# Patient Record
Sex: Male | Born: 1945 | Race: White | Hispanic: No | Marital: Married | Smoking: Former smoker
Health system: Southern US, Community
[De-identification: ages and names within clinical notes are randomized; demographics above are authoritative.]

## PROBLEM LIST (undated history)

## (undated) DIAGNOSIS — E669 Obesity, unspecified: Secondary | ICD-10-CM

## (undated) DIAGNOSIS — J45909 Unspecified asthma, uncomplicated: Secondary | ICD-10-CM

## (undated) DIAGNOSIS — B269 Mumps without complication: Secondary | ICD-10-CM

## (undated) DIAGNOSIS — E559 Vitamin D deficiency, unspecified: Secondary | ICD-10-CM

## (undated) DIAGNOSIS — E119 Type 2 diabetes mellitus without complications: Secondary | ICD-10-CM

## (undated) DIAGNOSIS — Z720 Tobacco use: Principal | ICD-10-CM

## (undated) DIAGNOSIS — E785 Hyperlipidemia, unspecified: Secondary | ICD-10-CM

## (undated) DIAGNOSIS — Z87891 Personal history of nicotine dependence: Secondary | ICD-10-CM

## (undated) DIAGNOSIS — I10 Essential (primary) hypertension: Secondary | ICD-10-CM

## (undated) DIAGNOSIS — M549 Dorsalgia, unspecified: Secondary | ICD-10-CM

## (undated) DIAGNOSIS — E538 Deficiency of other specified B group vitamins: Secondary | ICD-10-CM

## (undated) DIAGNOSIS — L989 Disorder of the skin and subcutaneous tissue, unspecified: Secondary | ICD-10-CM

## (undated) DIAGNOSIS — E1169 Type 2 diabetes mellitus with other specified complication: Secondary | ICD-10-CM

## (undated) DIAGNOSIS — B059 Measles without complication: Secondary | ICD-10-CM

## (undated) DIAGNOSIS — M25511 Pain in right shoulder: Secondary | ICD-10-CM

## (undated) DIAGNOSIS — J449 Chronic obstructive pulmonary disease, unspecified: Secondary | ICD-10-CM

## (undated) DIAGNOSIS — R0989 Other specified symptoms and signs involving the circulatory and respiratory systems: Secondary | ICD-10-CM

## (undated) DIAGNOSIS — F4310 Post-traumatic stress disorder, unspecified: Secondary | ICD-10-CM

## (undated) DIAGNOSIS — N400 Enlarged prostate without lower urinary tract symptoms: Secondary | ICD-10-CM

## (undated) DIAGNOSIS — B019 Varicella without complication: Secondary | ICD-10-CM

## (undated) HISTORY — PX: APPENDECTOMY: SHX54

## (undated) HISTORY — DX: Unspecified asthma, uncomplicated: J45.909

## (undated) HISTORY — DX: Hyperlipidemia, unspecified: E78.5

## (undated) HISTORY — DX: Essential (primary) hypertension: I10

## (undated) HISTORY — DX: Type 2 diabetes mellitus with other specified complication: E11.69

## (undated) HISTORY — DX: Varicella without complication: B01.9

## (undated) HISTORY — DX: Dorsalgia, unspecified: M54.9

## (undated) HISTORY — DX: Chronic obstructive pulmonary disease, unspecified: J44.9

## (undated) HISTORY — DX: Obesity, unspecified: E66.9

## (undated) HISTORY — DX: Pain in right shoulder: M25.511

## (undated) HISTORY — DX: Post-traumatic stress disorder, unspecified: F43.10

## (undated) HISTORY — DX: Type 2 diabetes mellitus without complications: E11.9

## (undated) HISTORY — DX: Personal history of nicotine dependence: Z87.891

## (undated) HISTORY — DX: Other specified symptoms and signs involving the circulatory and respiratory systems: R09.89

## (undated) HISTORY — PX: ROTATOR CUFF REPAIR: SHX139

## (undated) HISTORY — DX: Benign prostatic hyperplasia without lower urinary tract symptoms: N40.0

## (undated) HISTORY — PX: VASECTOMY: SHX75

## (undated) HISTORY — PX: FOOT SURGERY: SHX648

## (undated) HISTORY — DX: Vitamin D deficiency, unspecified: E55.9

## (undated) HISTORY — DX: Disorder of the skin and subcutaneous tissue, unspecified: L98.9

## (undated) HISTORY — DX: Tobacco use: Z72.0

## (undated) HISTORY — PX: NASAL SEPTUM SURGERY: SHX37

## (undated) HISTORY — DX: Mumps without complication: B26.9

## (undated) HISTORY — DX: Measles without complication: B05.9

## (undated) HISTORY — DX: Deficiency of other specified B group vitamins: E53.8

---

## 1999-05-26 ENCOUNTER — Encounter: Payer: Self-pay | Admitting: Gastroenterology

## 1999-05-26 ENCOUNTER — Encounter: Admission: RE | Admit: 1999-05-26 | Discharge: 1999-05-26 | Payer: Self-pay | Admitting: Gastroenterology

## 1999-07-27 ENCOUNTER — Ambulatory Visit: Admission: RE | Admit: 1999-07-27 | Discharge: 1999-07-27 | Payer: Self-pay | Admitting: Emergency Medicine

## 1999-08-10 ENCOUNTER — Ambulatory Visit (HOSPITAL_COMMUNITY): Admission: RE | Admit: 1999-08-10 | Discharge: 1999-08-10 | Payer: Self-pay | Admitting: Gastroenterology

## 2002-05-22 ENCOUNTER — Emergency Department (HOSPITAL_COMMUNITY): Admission: EM | Admit: 2002-05-22 | Discharge: 2002-05-22 | Payer: Self-pay | Admitting: Emergency Medicine

## 2002-05-22 ENCOUNTER — Encounter: Payer: Self-pay | Admitting: Emergency Medicine

## 2004-10-27 ENCOUNTER — Ambulatory Visit (HOSPITAL_COMMUNITY): Admission: RE | Admit: 2004-10-27 | Discharge: 2004-10-27 | Payer: Self-pay | Admitting: Gastroenterology

## 2012-09-14 LAB — HM COLONOSCOPY

## 2012-11-21 ENCOUNTER — Encounter: Payer: Self-pay | Admitting: Family Medicine

## 2012-11-21 ENCOUNTER — Ambulatory Visit (INDEPENDENT_AMBULATORY_CARE_PROVIDER_SITE_OTHER): Payer: 59 | Admitting: Family Medicine

## 2012-11-21 VITALS — BP 122/68 | HR 84 | Temp 98.8°F | Ht 68.0 in | Wt 208.1 lb

## 2012-11-21 DIAGNOSIS — E559 Vitamin D deficiency, unspecified: Secondary | ICD-10-CM

## 2012-11-21 DIAGNOSIS — I10 Essential (primary) hypertension: Secondary | ICD-10-CM

## 2012-11-21 DIAGNOSIS — E538 Deficiency of other specified B group vitamins: Secondary | ICD-10-CM

## 2012-11-21 DIAGNOSIS — Z87891 Personal history of nicotine dependence: Secondary | ICD-10-CM

## 2012-11-21 DIAGNOSIS — E119 Type 2 diabetes mellitus without complications: Secondary | ICD-10-CM

## 2012-11-21 DIAGNOSIS — E669 Obesity, unspecified: Secondary | ICD-10-CM | POA: Insufficient documentation

## 2012-11-21 DIAGNOSIS — J449 Chronic obstructive pulmonary disease, unspecified: Secondary | ICD-10-CM | POA: Insufficient documentation

## 2012-11-21 DIAGNOSIS — I999 Unspecified disorder of circulatory system: Secondary | ICD-10-CM

## 2012-11-21 DIAGNOSIS — E1169 Type 2 diabetes mellitus with other specified complication: Secondary | ICD-10-CM | POA: Insufficient documentation

## 2012-11-21 DIAGNOSIS — N400 Enlarged prostate without lower urinary tract symptoms: Secondary | ICD-10-CM

## 2012-11-21 DIAGNOSIS — E785 Hyperlipidemia, unspecified: Secondary | ICD-10-CM | POA: Insufficient documentation

## 2012-11-21 DIAGNOSIS — F17200 Nicotine dependence, unspecified, uncomplicated: Secondary | ICD-10-CM

## 2012-11-21 DIAGNOSIS — R0989 Other specified symptoms and signs involving the circulatory and respiratory systems: Secondary | ICD-10-CM

## 2012-11-21 DIAGNOSIS — Z720 Tobacco use: Secondary | ICD-10-CM

## 2012-11-21 DIAGNOSIS — J45909 Unspecified asthma, uncomplicated: Secondary | ICD-10-CM | POA: Insufficient documentation

## 2012-11-21 DIAGNOSIS — F4310 Post-traumatic stress disorder, unspecified: Secondary | ICD-10-CM | POA: Insufficient documentation

## 2012-11-21 HISTORY — DX: Tobacco use: Z72.0

## 2012-11-21 HISTORY — DX: Deficiency of other specified B group vitamins: E53.8

## 2012-11-21 HISTORY — DX: Personal history of nicotine dependence: Z87.891

## 2012-11-21 HISTORY — DX: Vitamin D deficiency, unspecified: E55.9

## 2012-11-21 HISTORY — DX: Benign prostatic hyperplasia without lower urinary tract symptoms: N40.0

## 2012-11-21 NOTE — Patient Instructions (Addendum)
Rel of rec immunization and last set of labsPreventive Care for Adults, Male A healthy lifestyle and preventive care can promote health and wellness. Preventive health guidelines for men include the following key practices:  A routine yearly physical is a good way to check with your caregiver about your health and preventative screening. It is a chance to share any concerns and updates on your health, and to receive a thorough exam.  Visit your dentist for a routine exam and preventative care every 6 months. Brush your teeth twice a day and floss once a day. Good oral hygiene prevents tooth decay and gum disease.  The frequency of eye exams is based on your age, health, family medical history, use of contact lenses, and other factors. Follow your caregiver's recommendations for frequency of eye exams.  Eat a healthy diet. Foods like vegetables, fruits, whole grains, low-fat dairy products, and lean protein foods contain the nutrients you need without too many calories. Decrease your intake of foods high in solid fats, added sugars, and salt. Eat the right amount of calories for you.Get information about a proper diet from your caregiver, if necessary.  Regular physical exercise is one of the most important things you can do for your health. Most adults should get at least 150 minutes of moderate-intensity exercise (any activity that increases your heart rate and causes you to sweat) each week. In addition, most adults need muscle-strengthening exercises on 2 or more days a week.  Maintain a healthy weight. The body mass index (BMI) is a screening tool to identify possible weight problems. It provides an estimate of body fat based on height and weight. Your caregiver can help determine your BMI, and can help you achieve or maintain a healthy weight.For adults 20 years and older:  A BMI below 18.5 is considered underweight.  A BMI of 18.5 to 24.9 is normal.  A BMI of 25 to 29.9 is considered  overweight.  A BMI of 30 and above is considered obese.  Maintain normal blood lipids and cholesterol levels by exercising and minimizing your intake of saturated fat. Eat a balanced diet with plenty of fruit and vegetables. Blood tests for lipids and cholesterol should begin at age 12 and be repeated every 5 years. If your lipid or cholesterol levels are high, you are over 50, or you are a high risk for heart disease, you may need your cholesterol levels checked more frequently.Ongoing high lipid and cholesterol levels should be treated with medicines if diet and exercise are not effective.  If you smoke, find out from your caregiver how to quit. If you do not use tobacco, do not start.  If you choose to drink alcohol, do not exceed 2 drinks per day. One drink is considered to be 12 ounces (355 mL) of beer, 5 ounces (148 mL) of wine, or 1.5 ounces (44 mL) of liquor.  Avoid use of street drugs. Do not share needles with anyone. Ask for help if you need support or instructions about stopping the use of drugs.  High blood pressure causes heart disease and increases the risk of stroke. Your blood pressure should be checked at least every 1 to 2 years. Ongoing high blood pressure should be treated with medicines, if weight loss and exercise are not effective.  If you are 69 to 67 years old, ask your caregiver if you should take aspirin to prevent heart disease.  Diabetes screening involves taking a blood sample to check your fasting blood sugar level.  This should be done once every 3 years, after age 51, if you are within normal weight and without risk factors for diabetes. Testing should be considered at a younger age or be carried out more frequently if you are overweight and have at least 1 risk factor for diabetes.  Colorectal cancer can be detected and often prevented. Most routine colorectal cancer screening begins at the age of 34 and continues through age 45. However, your caregiver may  recommend screening at an earlier age if you have risk factors for colon cancer. On a yearly basis, your caregiver may provide home test kits to check for hidden blood in the stool. Use of a small camera at the end of a tube, to directly examine the colon (sigmoidoscopy or colonoscopy), can detect the earliest forms of colorectal cancer. Talk to your caregiver about this at age 71, when routine screening begins. Direct examination of the colon should be repeated every 5 to 10 years through age 12, unless early forms of pre-cancerous polyps or small growths are found.  Hepatitis C blood testing is recommended for all people born from [birth year] through 1965 and any individual with known risks for hepatitis C.  Practice safe sex. Use condoms and avoid high-risk sexual practices to reduce the spread of sexually transmitted infections (STIs). STIs include gonorrhea, chlamydia, syphilis, trichomonas, herpes, HPV, and human immunodeficiency virus (HIV). Herpes, HIV, and HPV are viral illnesses that have no cure. They can result in disability, cancer, and death.  A one-time screening for abdominal aortic aneurysm (AAA) and surgical repair of large AAAs by sound wave imaging (ultrasonography) is recommended for ages 35 to 39 years who are current or former smokers.  Healthy men should no longer receive prostate-specific antigen (PSA) blood tests as part of routine cancer screening. Consult with your caregiver about prostate cancer screening.  Testicular cancer screening is not recommended for adult males who have no symptoms. Screening includes self-exam, caregiver exam, and other screening tests. Consult with your caregiver about any symptoms you have or any concerns you have about testicular cancer.  Use sunscreen with skin protection factor (SPF) of 30 or more. Apply sunscreen liberally and repeatedly throughout the day. You should seek shade when your shadow is shorter than you. Protect yourself by wearing long  sleeves, pants, a wide-brimmed hat, and sunglasses year round, whenever you are outdoors.  Once a month, do a whole body skin exam, using a mirror to look at the skin on your back. Notify your caregiver of new moles, moles that have irregular borders, moles that are larger than a pencil eraser, or moles that have changed in shape or color.  Stay current with required immunizations.  Influenza. You need a dose every fall (or winter). The composition of the flu vaccine changes each year, so being vaccinated once is not enough.  Pneumococcal polysaccharide. You need 1 to 2 doses if you smoke cigarettes or if you have certain chronic medical conditions. You need 1 dose at age 68 (or older) if you have never been vaccinated.  Tetanus, diphtheria, pertussis (Tdap, Td). Get 1 dose of Tdap vaccine if you are younger than age 57 years, are over 57 and have contact with an infant, are a Research scientist (physical sciences), or simply want to be protected from whooping cough. After that, you need a Td booster dose every 10 years. Consult your caregiver if you have not had at least 3 tetanus and diphtheria-containing shots sometime in your life or have a deep or  dirty wound.  HPV. This vaccine is recommended for males 13 through 67 years of age. This vaccine may be given to men 22 through 67 years of age who have not completed the 3 dose series. It is recommended for men through age 36 who have sex with men or whose immune system is weakened because of HIV infection, other illness, or medications. The vaccine is given in 3 doses over 6 months.  Measles, mumps, rubella (MMR). You need at least 1 dose of MMR if you were born in 1957 or later. You may also need a 2nd dose.  Meningococcal. If you are age 87 to 68 years and a Orthoptist living in a residence hall, or have one of several medical conditions, you need to get vaccinated against meningococcal disease. You may also need additional booster doses.  Zoster  (shingles). If you are age 35 years or older, you should get this vaccine.  Varicella (chickenpox). If you have never had chickenpox or you were vaccinated but received only 1 dose, talk to your caregiver to find out if you need this vaccine.  Hepatitis A. You need this vaccine if you have a specific risk factor for hepatitis A virus infection, or you simply wish to be protected from this disease. The vaccine is usually given as 2 doses, 6 to 18 months apart.  Hepatitis B. You need this vaccine if you have a specific risk factor for hepatitis B virus infection or you simply wish to be protected from this disease. The vaccine is given in 3 doses, usually over 6 months. Preventative Service / Frequency Ages 77 to 67  Blood pressure check.** / Every 1 to 2 years.  Lipid and cholesterol check.** / Every 5 years beginning at age 66.  Hepatitis C blood test.** / For any individual with known risks for hepatitis C.  Skin self-exam. / Monthly.  Influenza immunization.** / Every year.  Pneumococcal polysaccharide immunization.** / 1 to 2 doses if you smoke cigarettes or if you have certain chronic medical conditions.  Tetanus, diphtheria, pertussis (Tdap,Td) immunization. / A one-time dose of Tdap vaccine. After that, you need a Td booster dose every 10 years.  HPV immunization. / 3 doses over 6 months, if 26 and younger.  Measles, mumps, rubella (MMR) immunization. / You need at least 1 dose of MMR if you were born in 1957 or later. You may also need a 2nd dose.  Meningococcal immunization. / 1 dose if you are age 16 to 47 years and a Orthoptist living in a residence hall, or have one of several medical conditions, you need to get vaccinated against meningococcal disease. You may also need additional booster doses.  Varicella immunization.** / Consult your caregiver.  Hepatitis A immunization.** / Consult your caregiver. 2 doses, 6 to 18 months apart.  Hepatitis B  immunization.** / Consult your caregiver. 3 doses usually over 6 months. Ages 3 to 63  Blood pressure check.** / Every 1 to 2 years.  Lipid and cholesterol check.** / Every 5 years beginning at age 25.  Fecal occult blood test (FOBT) of stool. / Every year beginning at age 70 and continuing until age 1. You may not have to do this test if you get colonoscopy every 10 years.  Flexible sigmoidoscopy** or colonoscopy.** / Every 5 years for a flexible sigmoidoscopy or every 10 years for a colonoscopy beginning at age 29 and continuing until age 74.  Hepatitis C blood test.** / For all  people born from [birth year] through 1965 and any individual with known risks for hepatitis C.  Skin self-exam. / Monthly.  Influenza immunization.** / Every year.  Pneumococcal polysaccharide immunization.** / 1 to 2 doses if you smoke cigarettes or if you have certain chronic medical conditions.  Tetanus, diphtheria, pertussis (Tdap/Td) immunization.** / A one-time dose of Tdap vaccine. After that, you need a Td booster dose every 10 years.  Measles, mumps, rubella (MMR) immunization. / You need at least 1 dose of MMR if you were born in 1957 or later. You may also need a 2nd dose.  Varicella immunization.**/ Consult your caregiver.  Meningococcal immunization.** / Consult your caregiver.  Hepatitis A immunization.** / Consult your caregiver. 2 doses, 6 to 18 months apart.  Hepatitis B immunization.** / Consult your caregiver. 3 doses, usually over 6 months. Ages 69 and over  Blood pressure check.** / Every 1 to 2 years.  Lipid and cholesterol check.**/ Every 5 years beginning at age 94.  Fecal occult blood test (FOBT) of stool. / Every year beginning at age 43 and continuing until age 32. You may not have to do this test if you get colonoscopy every 10 years.  Flexible sigmoidoscopy** or colonoscopy.** / Every 5 years for a flexible sigmoidoscopy or every 10 years for a colonoscopy beginning at age 70  and continuing until age 64.  Hepatitis C blood test.** / For all people born from [birth year] through 1965 and any individual with known risks for hepatitis C.  Abdominal aortic aneurysm (AAA) screening.** / A one-time screening for ages 5 to 90 years who are current or former smokers.  Skin self-exam. / Monthly.  Influenza immunization.** / Every year.  Pneumococcal polysaccharide immunization.** / 1 dose at age 98 (or older) if you have never been vaccinated.  Tetanus, diphtheria, pertussis (Tdap, Td) immunization. / A one-time dose of Tdap vaccine if you are over 65 and have contact with an infant, are a Research scientist (physical sciences), or simply want to be protected from whooping cough. After that, you need a Td booster dose every 10 years.  Varicella immunization. ** / Consult your caregiver.  Meningococcal immunization.** / Consult your caregiver.  Hepatitis A immunization. ** / Consult your caregiver. 2 doses, 6 to 18 months apart.  Hepatitis B immunization.** / Check with your caregiver. 3 doses, usually over 6 months. **Family history and personal history of risk and conditions may change your caregiver's recommendations. Document Released: 08/29/2001 Document Revised: 09/25/2011 Document Reviewed: 11/28/2010 [facility] Patient Information 2013 [place], [place].  at the [facility]

## 2012-11-24 NOTE — Assessment & Plan Note (Signed)
Avoid trans fats, consider krill oil supplement. Continue Gemfibrozil

## 2012-11-24 NOTE — Assessment & Plan Note (Signed)
Patient takes a supplement

## 2012-11-24 NOTE — Assessment & Plan Note (Signed)
Follows at the [place]

## 2012-11-24 NOTE — Assessment & Plan Note (Signed)
Encouraged aspirin, quit smoking, krill oil ongoing exercise

## 2012-11-24 NOTE — Assessment & Plan Note (Signed)
Well controlled,  No control

## 2012-11-24 NOTE — Assessment & Plan Note (Signed)
Discussed various strategies for cessation will proceed without medications initially and will let us know if he is not succeeding

## 2012-11-24 NOTE — Assessment & Plan Note (Addendum)
Has his labs followed at [facility] agrees to try and supply [place] with a copy of his most recent labs just for a baseline. Reports good control, believes his last hgba1c was 7.4

## 2012-11-24 NOTE — Progress Notes (Signed)
Patient ID: [facility], male   DOB: 22-Feb-1946, 67 y.o.   MRN: [id number] [facility] [id number] 04-Dec-1945 11/24/2012      Progress Note New Patient  Subjective  Chief Complaint  Chief Complaint  Patient presents with  . Establish Care    new patient    HPI  Patient is a 67 year old Caucasian male who is in today to establish care. He receives the vast majority of his care at the [facility]. He has a 70% service-connected disability related to his PTSD from his service in [facility] to receive care there on a regular basis. Needs a local physician for acute issues. No recent illness. No fevers or chills. Reports his last hemoglobin A1c was 7.4. Has a long-standing history of intermittent sensation of lightheaded versus dizzy feelings not worsening. Notes occasional nausea and varicose veins in his right leg which are largely not symptomatic. Denies chest pain or palpitations. No shortness of breath GI or GU concerns otherwise noted today.  Past Medical History  Diagnosis Date  . Measles as a child  . Mumps as a child  . Chicken pox as a child  . 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 Hyperlipidemia   . Poor circulation     in legs  . Hypertension   . Asthma   . COPD (chronic obstructive pulmonary disease) 67 yrs old  . Tobacco use 11/21/2012  . PTSD (post-traumatic stress disorder)   . BPH (benign prostatic hyperplasia) 11/21/2012  . Unspecified vitamin D deficiency 11/21/2012  . Vitamin B12 deficiency 11/21/2012    Past Surgical History  Procedure Laterality Date  . Appendectomy    . Foot surgery  40 yrs ago    left foot  . Rotator cuff repair  67 yrs old    left shoulder  . Nasal septum surgery  67 yrs old  . Vasectomy  43    Family History  Problem Relation Age of Onset  . Diabetes Mother     type 2  . Hypertension Mother   . Hyperlipidemia Mother   . Stroke Mother   . Diabetes Father     type 2  . Ulcers Father   .  Hypertension Father   . Hyperlipidemia Father   . Heart attack Father     X 5  . Cancer Father     around aorta  . Heart disease Sister   . Heart disease Brother   . Diabetes Brother     type 2  . Diabetes Son     type 2  . Diabetes Sister     type 2- diabetic coma  . Diabetes Sister     ?  Marland Kitchen Dementia Sister   . Cancer Brother 50    colon  . Diabetes Brother     type 2  . Leukemia Brother   . Dementia Brother     History   Social History  . Marital Status: Married    Spouse Name: N/A    Number of Children: N/A  . Years of Education: N/A   Occupational History  . Not on file.   Social History Main Topics  . Smoking status: Current Every Day Smoker -- 1.00 packs/day for 55 years    Types: Cigarettes  . Smokeless tobacco: Never Used  . Alcohol Use: Yes     Comment: socially- hardly ever  . Drug Use: No  . Sexually Active: Not on file  Other Topics Concern  . Not on file   Social History Narrative  . No narrative on file    No current outpatient prescriptions on file prior to visit.   No current facility-administered medications on file prior to visit.    No Known Allergies  Review of Systems  Review of Systems  Constitutional: Negative for fever, chills and malaise/fatigue.  HENT: Negative for hearing loss, nosebleeds and congestion.   Eyes: Negative for discharge.  Respiratory: Positive for shortness of breath. Negative for cough, sputum production and wheezing.   Cardiovascular: Negative for chest pain, palpitations and leg swelling.  Gastrointestinal: Negative for heartburn, nausea, vomiting, abdominal pain, diarrhea, constipation and blood in stool.  Genitourinary: Negative for dysuria, urgency, frequency and hematuria.  Musculoskeletal: Negative for myalgias, back pain and falls.  Skin: Negative for rash.  Neurological: Positive for dizziness. Negative for tremors, sensory change, focal weakness, loss of consciousness, weakness and headaches.   Endo/Heme/Allergies: Negative for polydipsia. Does not bruise/bleed easily.  Psychiatric/Behavioral: Negative for depression and suicidal ideas. The patient is nervous/anxious. The patient does not have insomnia.     Objective  BP 122/68  Pulse 84  Temp(Src) 98.8 F (37.1 C) (Oral)  Ht 5\' 8"  (1.727 m)  Wt 208 lb 1.9 oz (94.403 kg)  BMI 31.65 kg/m2  SpO2 97%  Physical Exam  Physical Exam  Constitutional: He is oriented to person, place, and time and well-developed, well-nourished, and in no distress. No distress.  HENT:  Head: Normocephalic and atraumatic.  Right Ear: External ear normal.  Left Ear: External ear normal.  Nose: Nose normal.  Mouth/Throat: Oropharynx is clear and moist. No oropharyngeal exudate.  Eyes: Conjunctivae and EOM are normal. Pupils are equal, round, and reactive to light. Right eye exhibits no discharge. Left eye exhibits no discharge. No scleral icterus.  Neck: Neck supple. No thyromegaly present.  Cardiovascular: Normal rate, regular rhythm and normal heart sounds.   Pulmonary/Chest: Effort normal and breath sounds normal. No respiratory distress.  Abdominal: He exhibits no distension and no mass. There is no tenderness.  Musculoskeletal: Normal range of motion. He exhibits no edema and no tenderness.  Lymphadenopathy:    He has no cervical adenopathy.  Neurological: He is alert and oriented to person, place, and time.  Skin: Skin is warm and dry. No erythema.  Psychiatric: Memory, affect and judgment normal.       Assessment & Plan  Tobacco use Discussed various strategies for cessation will proceed without medications initially and will let us know if he is not succeeding  Hyperlipidemia Avoid trans fats, consider krill oil supplement. Continue Gemfibrozil  Diabetes mellitus without complication Has his labs followed at [facility] agrees to try and supply [place] with a copy of his most recent labs just for a baseline. Reports good control, believes  his last hgba1c was 7.4  Poor circulation Encouraged aspirin, quit smoking, krill oil ongoing exercise   Hypertension Well controlled,  No control  PTSD (post-traumatic stress disorder) Follows at the [facility]  Unspecified vitamin D deficiency Patient takes a supplement

## 2012-11-29 ENCOUNTER — Telehealth: Payer: Self-pay | Admitting: Family Medicine

## 2012-11-29 NOTE — Telephone Encounter (Signed)
Received medical records from the Department of [facility].

## 2012-12-10 ENCOUNTER — Encounter: Payer: Self-pay | Admitting: Family Medicine

## 2013-04-24 ENCOUNTER — Encounter: Payer: Self-pay | Admitting: Family Medicine

## 2013-04-24 ENCOUNTER — Telehealth (INDEPENDENT_AMBULATORY_CARE_PROVIDER_SITE_OTHER): Payer: 59 | Admitting: Family Medicine

## 2013-04-24 VITALS — BP 122/62 | HR 77 | Temp 98.3°F | Ht 68.0 in | Wt 208.0 lb

## 2013-04-24 DIAGNOSIS — L989 Disorder of the skin and subcutaneous tissue, unspecified: Secondary | ICD-10-CM

## 2013-04-24 DIAGNOSIS — E119 Type 2 diabetes mellitus without complications: Secondary | ICD-10-CM

## 2013-04-24 DIAGNOSIS — E785 Hyperlipidemia, unspecified: Secondary | ICD-10-CM

## 2013-04-24 DIAGNOSIS — Z23 Encounter for immunization: Secondary | ICD-10-CM

## 2013-04-24 DIAGNOSIS — M549 Dorsalgia, unspecified: Secondary | ICD-10-CM

## 2013-04-24 DIAGNOSIS — I10 Essential (primary) hypertension: Secondary | ICD-10-CM

## 2013-04-24 LAB — CBC
HCT: 38.1 % — ABNORMAL LOW (ref 39.0–52.0)
MCV: 93.6 fL (ref 78.0–100.0)
Platelets: 196 10*3/uL (ref 150–400)
RBC: 4.07 MIL/uL — ABNORMAL LOW (ref 4.22–5.81)
WBC: 5.7 10*3/uL (ref 4.0–10.5)

## 2013-04-24 LAB — LIPID PANEL
Cholesterol: 138 mg/dL (ref 0–200)
HDL: 34 mg/dL — ABNORMAL LOW (ref >39)
Total CHOL/HDL Ratio: 4.1 Ratio
Triglycerides: 238 mg/dL — ABNORMAL HIGH (ref <150)
VLDL: 48 mg/dL — ABNORMAL HIGH (ref 0–40)

## 2013-04-24 LAB — HEPATIC FUNCTION PANEL
Albumin: 4.4 g/dL (ref 3.5–5.2)
Total Bilirubin: 0.4 mg/dL (ref 0.3–1.2)
Total Protein: 6.6 g/dL (ref 6.0–8.3)

## 2013-04-24 LAB — RENAL FUNCTION PANEL
CO2: 25 mEq/L (ref 19–32)
Chloride: 107 mEq/L (ref 96–112)
Phosphorus: 2.3 mg/dL (ref 2.3–4.6)
Sodium: 139 mEq/L (ref 135–145)

## 2013-04-24 LAB — HEMOGLOBIN A1C
Hgb A1c MFr Bld: 6.3 % — ABNORMAL HIGH (ref <5.7)
Mean Plasma Glucose: 134 mg/dL — ABNORMAL HIGH (ref <117)

## 2013-04-24 NOTE — Patient Instructions (Signed)
Actinic Keratosis  Actinic keratosis is a precancerous growth on the skin. This means it could develop into skin cancer if it is not treated. About 1% of actinic keratoses turn into skin cancer within a year. It is important to have all such growths removed to prevent them from developing into skin cancer.  CAUSES   Actinic keratosis is caused by getting too much ultraviolet (UV) radiation from the sun or other UV light sources.  RISK FACTORS  Factors that increase your chances of getting actinic keratosis include:   Having light-colored skin and blue eyes.   Having blonde or red hair.   Spending a lot of time in the sun.   Age. The risk of actinic keratosis increases with age.  SYMPTOMS   Actinic keratosis growths look like scaly, rough spots of skin. They can be as small as a pinhead or as big as a quarter. They may itch, hurt, or feel sensitive. Sometimes there is a little tag of pink or gray skin growing off them. In some cases, actinic keratoses are easier felt than seen. They do not go away with the use of moisturizing lotions or creams. Actinic keratoses appear most often on areas of skin that get a lot of sun exposure. These areas include the:   Scalp.   Face.   Ears.   Lips.   Upper back.   Backs of the hands.   Forearms.  DIAGNOSIS   Your caregiver can usually tell what is wrong by performing a physical exam. A tissue sample (biopsy) may also be taken and examined under a microscope.  TREATMENT   Actinic keratosis can be treated several ways. Most treatments can be done in your caregiver's office. Treatment options may include:   Curettage. A tool is used to gently scrape off the growth.   Cryosurgery. Liquid nitrogen is applied to the growth to freeze it. The growth eventually falls off the skin.   Medicated creams, such as 5-fluorouracil or imiquimod. The medicine destroys the cells in the growth.    Chemical peels. Chemicals are applied to the growth and the outer layers of skin are peeled off.   Photodynamic therapy. A drug that makes your skin more sensitive to light is applied to the skin. A strong, blue light is aimed at the skin and destroys the growth.  PREVENTION   To prevent future sun damage:   Try to avoid the sun between 10:00 a.m. and 4:00 p.m. when it is the strongest.   Use a sunscreen or sunblock with SPF 30 or greater.   Apply sunscreen at least 30 minutes before exposure to the sun.   Always wear protective hats, clothing, and sunglasses with UV protection.   Avoid medicines, herbs, and foods that increase your sensitivity to sunlight.   Avoid tanning beds.  HOME CARE INSTRUCTIONS    If your skin was covered with a bandage, change and remove the bandage as directed by your caregiver.   Keep the treated area dry as directed by your caregiver.   Apply any creams as prescribed by your caregiver. Follow the directions carefully.   Check your skin regularly for any changes.   Visit a skin doctor (dermatologist) every year for a skin exam.  SEEK MEDICAL CARE IF:    Your skin does not heal and becomes irritated, red, or bleeds.   You notice any changes or new growths on your skin.  Document Released: 09/29/2008 Document Revised: 09/25/2011 Document Reviewed: 08/14/2011  [employer] Patient Information   2014 [employer]

## 2013-04-24 NOTE — Progress Notes (Signed)
Patient ID: ARAV BANNISTER, male   DOB: [birth year], 67 y.o.   MRN: [id number] HARLON KUTNER [id number] 1946/04/15 04/24/2013      Progress Note-Follow Up  Subjective  Chief Complaint  Chief Complaint  Patient presents with  . sore on arm    not healing- left arm  . Injections    flu- high dose    HPI  This is a 67 Caucasian male who is in today to discuss a nonhealing lesion on his left arm. It's been present for some time roughly one to 2 months and will not resolve. He denies any trauma. He denies any infection or pain. Has been mildly scaly but not itchy. Otherwise feels well. No recent illness fevers chills. No polyuria or polydipsia. No chest pain, palpitations, shortness of breath, GI or GU complaints.  Past Medical History  Diagnosis Date  . Measles as a child  . Mumps as a child  . Chicken pox as a child  . 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 Hyperlipidemia   . Poor circulation     in legs  . Hypertension   . Asthma   . COPD (chronic obstructive pulmonary disease) 67 yrs old  . Tobacco use 11/21/2012  . PTSD (post-traumatic stress disorder)   . BPH (benign prostatic hyperplasia) 11/21/2012  . Unspecified vitamin D deficiency 11/21/2012  . Vitamin B12 deficiency 11/21/2012    Past Surgical History  Procedure Laterality Date  . Appendectomy    . Foot surgery  40 yrs ago    left foot  . Rotator cuff repair  67 yrs old    left shoulder  . Nasal septum surgery  67 yrs old  . Vasectomy  19    Family History  Problem Relation Age of Onset  . Diabetes Mother     type 2  . Hypertension Mother   . Hyperlipidemia Mother   . Stroke Mother   . Diabetes Father     type 2  . Ulcers Father   . Hypertension Father   . Hyperlipidemia Father   . Heart attack Father     X 5  . Cancer Father     around aorta  . Heart disease Sister   . Heart disease Brother   . Diabetes Brother     type 2  . Diabetes Son     type 2  . Diabetes Sister      type 2- diabetic coma  . Diabetes Sister     ?  Marland Kitchen Dementia Sister   . Cancer Brother 50    colon  . Diabetes Brother     type 2  . Leukemia Brother   . Dementia Brother     History   Social History  . Marital Status: Married    Spouse Name: N/A    Number of Children: N/A  . Years of Education: N/A   Occupational History  . Not on file.   Social History Main Topics  . Smoking status: Current Every Day Smoker -- 1.00 packs/day for 55 years    Types: Cigarettes  . Smokeless tobacco: Never Used  . Alcohol Use: Yes     Comment: socially- hardly ever  . Drug Use: No  . Sexual Activity: Not on file   Other Topics Concern  . Not on file   Social History Narrative  . No narrative on file    Current Outpatient Prescriptions on File Prior to  Visit  Medication Sig Dispense Refill  . aspirin 81 MG tablet Take 81 mg by mouth daily.      . Cholecalciferol (VITAMIN D-3) 1000 UNITS CAPS Take 1 capsule by mouth daily.      . cyanocobalamin 500 MCG tablet Take 500 mcg by mouth daily.      Marland Kitchen gemfibrozil (LOPID) 600 MG tablet Take 600 mg by mouth 2 (two) times daily before a meal.      . glipiZIDE (GLUCOTROL) 10 MG tablet Take 10 mg by mouth 2 (two) times daily before a meal.      . lisinopril (PRINIVIL,ZESTRIL) 20 MG tablet Take 20 mg by mouth daily.      . metFORMIN (GLUCOPHAGE) 1000 MG tablet Take 1,000 mg by mouth 2 (two) times daily with a meal.      . Multiple Vitamin (MULTIVITAMIN) tablet Take 1 tablet by mouth daily.      . Omega-3 Fatty Acids (FISH OIL) 1000 MG CAPS Take 1 capsule by mouth 2 (two) times daily.      . sertraline (ZOLOFT) 100 MG tablet Take 100 mg by mouth daily.      . tamsulosin (FLOMAX) 0.4 MG CAPS Take 0.4 mg by mouth daily.      . traZODone (DESYREL) 100 MG tablet Take 100 mg by mouth at bedtime.       No current facility-administered medications on file prior to visit.    No Known Allergies  Review of Systems  Review of Systems  Constitutional:  Negative for fever and malaise/fatigue.  HENT: Negative for congestion.   Eyes: Negative for discharge.  Respiratory: Negative for shortness of breath.   Cardiovascular: Negative for chest pain, palpitations and leg swelling.  Gastrointestinal: Negative for nausea, abdominal pain and diarrhea.  Genitourinary: Negative for dysuria.  Musculoskeletal: Negative for falls.  Skin: Negative for rash.       Lesion right arm raised, mildly tender, not warm or hot no drainage or scabbing  Neurological: Negative for loss of consciousness and headaches.  Endo/Heme/Allergies: Negative for polydipsia.  Psychiatric/Behavioral: Negative for depression and suicidal ideas. The patient is not nervous/anxious and does not have insomnia.     Objective  BP 122/62  Pulse 77  Temp(Src) 98.3 F (36.8 C) (Oral)  Ht 5\' 8"  (1.727 m)  Wt 208 lb (94.348 kg)  BMI 31.63 kg/m2  SpO2 97%  Physical Exam  Physical Exam  Constitutional: He is oriented to person, place, and time and well-developed, well-nourished, and in no distress. No distress.  HENT:  Head: Normocephalic and atraumatic.  Eyes: Conjunctivae are normal.  Neck: Neck supple. No thyromegaly present.  Cardiovascular: Normal rate, regular rhythm and normal heart sounds.   No murmur heard. Pulmonary/Chest: Effort normal and breath sounds normal. No respiratory distress.  Abdominal: He exhibits no distension and no mass. There is no tenderness.  Musculoskeletal: He exhibits no edema.  Neurological: He is alert and oriented to person, place, and time.  Skin: Skin is warm.  1 cm raised firm mildly erythematous lesion right forearm no fluctuance or warmth  Psychiatric: Memory, affect and judgment normal.      Assessment & Plan  Hypertension Well controlled at this time no changes  Skin lesion of left arm Lesion has been persistent for a couple months on left arm and is not resolving will refer to dermatology for further consideration.    Diabetes mellitus without complication Given flu shot today

## 2013-04-25 LAB — URINALYSIS
Leukocytes, UA: NEGATIVE
Nitrite: NEGATIVE
Specific Gravity, Urine: 1.012 (ref 1.005–1.030)
pH: 7.5 (ref 5.0–8.0)

## 2013-04-25 LAB — TSH: TSH: 1.417 u[IU]/mL (ref 0.350–4.500)

## 2013-04-27 ENCOUNTER — Encounter: Payer: Self-pay | Admitting: Family Medicine

## 2013-04-27 DIAGNOSIS — L989 Disorder of the skin and subcutaneous tissue, unspecified: Secondary | ICD-10-CM

## 2013-04-27 HISTORY — DX: Disorder of the skin and subcutaneous tissue, unspecified: L98.9

## 2013-04-27 NOTE — Assessment & Plan Note (Signed)
Well controlled at this time no changes

## 2013-04-27 NOTE — Assessment & Plan Note (Signed)
Given flu shot  today 

## 2013-04-27 NOTE — Assessment & Plan Note (Signed)
Lesion has been persistent for a couple months on left arm and is not resolving will refer to dermatology for further consideration.

## 2013-05-19 ENCOUNTER — Ambulatory Visit (INDEPENDENT_AMBULATORY_CARE_PROVIDER_SITE_OTHER): Payer: 59 | Admitting: Family Medicine

## 2013-05-19 ENCOUNTER — Encounter: Payer: Self-pay | Admitting: Family Medicine

## 2013-05-19 ENCOUNTER — Ambulatory Visit (HOSPITAL_BASED_OUTPATIENT_CLINIC_OR_DEPARTMENT_OTHER)
Admission: RE | Admit: 2013-05-19 | Discharge: 2013-05-19 | Disposition: A | Discharge status: Home / Self Care | Payer: 59 | Source: Ambulatory Visit | Attending: Family Medicine | Admitting: Family Medicine

## 2013-05-19 VITALS — BP 118/72 | HR 80 | Temp 97.8°F | Ht 68.0 in | Wt 201.0 lb

## 2013-05-19 DIAGNOSIS — M549 Dorsalgia, unspecified: Secondary | ICD-10-CM | POA: Insufficient documentation

## 2013-05-19 DIAGNOSIS — I10 Essential (primary) hypertension: Secondary | ICD-10-CM

## 2013-05-19 DIAGNOSIS — M47817 Spondylosis without myelopathy or radiculopathy, lumbosacral region: Secondary | ICD-10-CM | POA: Insufficient documentation

## 2013-05-19 DIAGNOSIS — E119 Type 2 diabetes mellitus without complications: Secondary | ICD-10-CM

## 2013-05-19 DIAGNOSIS — Z720 Tobacco use: Secondary | ICD-10-CM

## 2013-05-19 DIAGNOSIS — F17200 Nicotine dependence, unspecified, uncomplicated: Secondary | ICD-10-CM

## 2013-05-19 MED ORDER — METHYLPREDNISOLONE (PAK) 4 MG PO TABS: ORAL_TABLET | ORAL | Status: DC

## 2013-05-19 MED ORDER — METHOCARBAMOL 500 MG PO TABS: 500.0000 mg | ORAL_TABLET | Freq: Every evening | ORAL | Status: DC | PRN

## 2013-05-19 NOTE — Patient Instructions (Signed)
Try moist heat to low back and salon pas patches or cream Hold Ibuprofen while taking steroids (Medrol dosepak) minimimize  Back Pain, Adult Low back pain is very common. About 1 in 5 people have back pain.The cause of low back pain is rarely dangerous. The pain often gets better over time.About half of people with a sudden onset of back pain feel better in just 2 weeks. About 8 in 10 people feel better by 6 weeks.  CAUSES Some common causes of back pain include:  Strain of the muscles or ligaments supporting the spine.  Wear and tear (degeneration) of the spinal discs.  Arthritis.  Direct injury to the back. DIAGNOSIS Most of the time, the direct cause of low back pain is not known.However, back pain can be treated effectively even when the exact cause of the pain is unknown.Answering your caregiver's questions about your overall health and symptoms is one of the most accurate ways to make sure the cause of your pain is not dangerous. If your caregiver needs more information, he or she may order lab work or imaging tests (X-rays or MRIs).However, even if imaging tests show changes in your back, this usually does not require surgery. HOME CARE INSTRUCTIONS For many people, back pain returns.Since low back pain is rarely dangerous, it is often a condition that people can learn to [facility] their own.   Remain active. It is stressful on the back to sit or stand in one place. Do not sit, drive, or stand in one place for more than 30 minutes at a time. Take short walks on level surfaces as soon as pain allows.Try to increase the length of time you walk each day.  Do not stay in bed.Resting more than 1 or 2 days can delay your recovery.  Do not avoid exercise or work.Your body is made to move.It is not dangerous to be active, even though your back may hurt.Your back will likely heal faster if you return to being active before your pain is gone.  Pay attention to your body when you  bend and lift. Many people have less discomfortwhen lifting if they bend their knees, keep the load close to their bodies,and avoid twisting. Often, the most comfortable positions are those that put less stress on your recovering back.  Find a comfortable position to sleep. Use a firm mattress and lie on your side with your knees slightly bent. If you lie on your back, put a pillow under your knees.  Only take over-the-counter or prescription medicines as directed by your caregiver. Over-the-counter medicines to reduce pain and inflammation are often the most helpful.Your caregiver may prescribe muscle relaxant drugs.These medicines help dull your pain so you can more quickly return to your normal activities and healthy exercise.  Put ice on the injured area.  Put ice in a plastic bag.  Place a towel between your skin and the bag.  Leave the ice on for 15-20 minutes, 3-4 times a day for the first 2 to 3 days. After that, ice and heat may be alternated to reduce pain and spasms.  Ask your caregiver about trying back exercises and gentle massage. This may be of some benefit.  Avoid feeling anxious or stressed.Stress increases muscle tension and can worsen back pain.It is important to recognize when you are anxious or stressed and learn ways to manage it.Exercise is a great option. SEEK MEDICAL CARE IF:  You have pain that is not relieved with rest or medicine.  You have  pain that does not improve in 1 week.  You have new symptoms.  You are generally not feeling well. SEEK IMMEDIATE MEDICAL CARE IF:   You have pain that radiates from your back into your legs.  You develop new bowel or bladder control problems.  You have unusual weakness or numbness in your arms or legs.  You develop nausea or vomiting.  You develop abdominal pain.  You feel faint. Document Released: 07/03/2005 Document Revised: 01/02/2012 Document Reviewed: 11/21/2010 [facility] Patient Information 2014  [place], [place].

## 2013-05-21 ENCOUNTER — Encounter: Payer: Self-pay | Admitting: Family Medicine

## 2013-05-21 ENCOUNTER — Telehealth: Payer: Self-pay

## 2013-05-21 DIAGNOSIS — M549 Dorsalgia, unspecified: Secondary | ICD-10-CM | POA: Insufficient documentation

## 2013-05-21 HISTORY — DX: Dorsalgia, unspecified: M54.9

## 2013-05-21 NOTE — Assessment & Plan Note (Signed)
Well controlled despite pain, no changes

## 2013-05-21 NOTE — Telephone Encounter (Signed)
Message copied by Bethann Punches E on Wed May 21, 2013 11:59 AM ------      Message from: Danise Edge A      Created: Mon May 19, 2013  9:27 PM       Notify no acute fracture or concern but significant degenerative changes are noted, if he does not get better he will need referral to ortho ------

## 2013-05-21 NOTE — Telephone Encounter (Signed)
Patient informed of results and stated that if pain is not getting better that he would discuss it with the PCP when he comes in for his visit on 05/26/2013.

## 2013-05-21 NOTE — Assessment & Plan Note (Signed)
Encouraged decreased carbohydrates and increased proteins while on steroids, call if numbers are running high

## 2013-05-21 NOTE — Progress Notes (Signed)
Patient ID: Peter Adams, male   DOB: December 19, 1945, 67 y.o.   MRN: [id number] Peter Adams [id number] [birth year] 05/21/2013      Progress Note-Follow Up  Subjective  Chief Complaint  Chief Complaint  Patient presents with  . Back Pain    lower back pain    HPI  Patient is a 67 year old Caucasian male who is in today for evaluation of worsening low to mid back pain. He has been using ibuprofen with minimal improvement. He denies any injury or fall. Trouble him and he was mid back pain over his lifetime but it has been more notable last few weeks. He denies bowel or bladder trouble. He denies chest pain palpitation shortness of breath fevers GI or GU concerns. No radicular symptoms down either leg.  Past Medical History  Diagnosis Date  . Measles as a child  . Mumps as a child  . Chicken pox as a child  . 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 Hyperlipidemia   . Poor circulation     in legs  . Hypertension   . Asthma   . COPD (chronic obstructive pulmonary disease) 67 yrs old  . Tobacco use 11/21/2012  . PTSD (post-traumatic stress disorder)   . BPH (benign prostatic hyperplasia) 11/21/2012  . Unspecified vitamin D deficiency 11/21/2012  . Vitamin B12 deficiency 11/21/2012  . Skin lesion of left arm 04/27/2013  . Back pain 05/21/2013    Past Surgical History  Procedure Laterality Date  . Appendectomy    . Foot surgery  40 yrs ago    left foot  . Rotator cuff repair  67 yrs old    left shoulder  . Nasal septum surgery  67 yrs old  . Vasectomy  67    Family History  Problem Relation Age of Onset  . Diabetes Mother     type 2  . Hypertension Mother   . Hyperlipidemia Mother   . Stroke Mother   . Diabetes Father     type 2  . Ulcers Father   . Hypertension Father   . Hyperlipidemia Father   . Heart attack Father     X 5  . Cancer Father     around aorta  . Heart disease Sister   . Heart disease Brother   . Diabetes Brother     type 2  .  Diabetes Son     type 2  . Diabetes Sister     type 2- diabetic coma  . Diabetes Sister     ?  Marland Kitchen Dementia Sister   . Cancer Brother 50    colon  . Diabetes Brother     type 2  . Leukemia Brother   . Dementia Brother     History   Social History  . Marital Status: Married    Spouse Name: N/A    Number of Children: N/A  . Years of Education: N/A   Occupational History  . Not on file.   Social History Main Topics  . Smoking status: Current Every Day Smoker -- 1.00 packs/day for 55 years    Types: Cigarettes  . Smokeless tobacco: Never Used  . Alcohol Use: Yes     Comment: socially- hardly ever  . Drug Use: No  . Sexual Activity: Not on file   Other Topics Concern  . Not on file   Social History Narrative  . No narrative on file    Current  Outpatient Prescriptions on File Prior to Visit  Medication Sig Dispense Refill  . aspirin 81 MG tablet Take 81 mg by mouth daily.      . Cholecalciferol (VITAMIN D-3) 1000 UNITS CAPS Take 1 capsule by mouth daily.      . cyanocobalamin 500 MCG tablet Take 500 mcg by mouth daily.      Marland Kitchen gemfibrozil (LOPID) 600 MG tablet Take 600 mg by mouth 2 (two) times daily before a meal.      . glipiZIDE (GLUCOTROL) 10 MG tablet Take 10 mg by mouth 2 (two) times daily before a meal.      . lisinopril (PRINIVIL,ZESTRIL) 20 MG tablet Take 20 mg by mouth daily.      . metFORMIN (GLUCOPHAGE) 1000 MG tablet Take 1,000 mg by mouth 2 (two) times daily with a meal.      . Multiple Vitamin (MULTIVITAMIN) tablet Take 1 tablet by mouth daily.      . Omega-3 Fatty Acids (FISH OIL) 1000 MG CAPS Take 1 capsule by mouth 2 (two) times daily.      . sertraline (ZOLOFT) 100 MG tablet Take 100 mg by mouth daily.      . tamsulosin (FLOMAX) 0.4 MG CAPS Take 0.4 mg by mouth daily.      . traZODone (DESYREL) 100 MG tablet Take 100 mg by mouth at bedtime.       No current facility-administered medications on file prior to visit.    No Known Allergies  Review  of Systems  Review of Systems  Constitutional: Negative for fever and malaise/fatigue.  HENT: Negative for congestion.   Eyes: Negative for discharge.  Respiratory: Negative for shortness of breath.   Cardiovascular: Negative for chest pain, palpitations and leg swelling.  Gastrointestinal: Negative for nausea, abdominal pain and diarrhea.  Genitourinary: Negative for dysuria.  Musculoskeletal: Positive for back pain. Negative for falls.  Skin: Negative for rash.  Neurological: Negative for loss of consciousness and headaches.  Endo/Heme/Allergies: Negative for polydipsia.  Psychiatric/Behavioral: Negative for depression and suicidal ideas. The patient is not nervous/anxious and does not have insomnia.     Objective  BP 118/72  Pulse 80  Temp(Src) 97.8 F (36.6 C) (Oral)  Ht 5\' 8"  (1.727 m)  Wt 201 lb 0.6 oz (91.191 kg)  BMI 30.58 kg/m2  SpO2 96%  Physical Exam  Physical Exam  Constitutional: He is oriented to person, place, and time and well-developed, well-nourished, and in no distress. No distress.  HENT:  Head: Normocephalic and atraumatic.  Eyes: Conjunctivae are normal.  Neck: Neck supple. No thyromegaly present.  Cardiovascular: Normal rate, regular rhythm and normal heart sounds.   No murmur heard. Pulmonary/Chest: Effort normal and breath sounds normal. No respiratory distress.  Abdominal: He exhibits no distension and no mass. There is no tenderness.  Musculoskeletal: He exhibits no edema.  Neurological: He is alert and oriented to person, place, and time.  Skin: Skin is warm.  Psychiatric: Memory, affect and judgment normal.    Lab Results  Component Value Date   TSH 1.417 04/24/2013   Lab Results  Component Value Date   WBC 5.7 04/24/2013   HGB 13.3 04/24/2013   HCT 38.1* 04/24/2013   MCV 93.6 04/24/2013   PLT 196 04/24/2013   Lab Results  Component Value Date   CREATININE 0.71 04/24/2013   BUN 14 04/24/2013   NA 139 04/24/2013   K 4.4 04/24/2013   CL  107 04/24/2013   CO2 25 04/24/2013  Lab Results  Component Value Date   ALT 22 04/24/2013   AST 19 04/24/2013   ALKPHOS 83 04/24/2013   BILITOT 0.4 04/24/2013   Lab Results  Component Value Date   CHOL 138 04/24/2013   Lab Results  Component Value Date   HDL 34* 04/24/2013   Lab Results  Component Value Date   LDLCALC 56 04/24/2013   Lab Results  Component Value Date   TRIG 238* 04/24/2013   Lab Results  Component Value Date   CHOLHDL 4.1 04/24/2013     Assessment & Plan  Hypertension Well controlled despite pain, no changes  Tobacco use Unfortunately continues to smoke. Encouraged cessation  Back pain Long history but worse over the past few weeks no injury. Started on Robaxin, Salon Pas and Medrol dosepak. Xray shows degenerative changes but no acute process. Call if no improvement   Diabetes mellitus without complication Encouraged decreased carbohydrates and increased proteins while on steroids, call if numbers are running high

## 2013-05-21 NOTE — Assessment & Plan Note (Signed)
Long history but worse over the past few weeks no injury. Started on Robaxin, Salon Pas and Medrol dosepak. Xray shows degenerative changes but no acute process. Call if no improvement

## 2013-05-21 NOTE — Assessment & Plan Note (Addendum)
Unfortunately continues to smoke. Encouraged cessation

## 2013-05-26 ENCOUNTER — Ambulatory Visit (INDEPENDENT_AMBULATORY_CARE_PROVIDER_SITE_OTHER): Payer: 59 | Admitting: Family Medicine

## 2013-05-26 ENCOUNTER — Telehealth: Payer: Self-pay | Admitting: Family Medicine

## 2013-05-26 ENCOUNTER — Encounter: Payer: Self-pay | Admitting: Family Medicine

## 2013-05-26 VITALS — BP 124/62 | HR 75 | Temp 97.8°F | Resp 18 | Ht 68.0 in | Wt 201.0 lb

## 2013-05-26 DIAGNOSIS — F329 Major depressive disorder, single episode, unspecified: Secondary | ICD-10-CM

## 2013-05-26 DIAGNOSIS — M549 Dorsalgia, unspecified: Secondary | ICD-10-CM

## 2013-05-26 DIAGNOSIS — E785 Hyperlipidemia, unspecified: Secondary | ICD-10-CM

## 2013-05-26 DIAGNOSIS — Z720 Tobacco use: Secondary | ICD-10-CM

## 2013-05-26 DIAGNOSIS — E669 Obesity, unspecified: Secondary | ICD-10-CM

## 2013-05-26 DIAGNOSIS — F32A Depression, unspecified: Secondary | ICD-10-CM

## 2013-05-26 DIAGNOSIS — F4310 Post-traumatic stress disorder, unspecified: Secondary | ICD-10-CM

## 2013-05-26 DIAGNOSIS — F17200 Nicotine dependence, unspecified, uncomplicated: Secondary | ICD-10-CM

## 2013-05-26 DIAGNOSIS — E119 Type 2 diabetes mellitus without complications: Secondary | ICD-10-CM

## 2013-05-26 DIAGNOSIS — I10 Essential (primary) hypertension: Secondary | ICD-10-CM

## 2013-05-26 HISTORY — DX: Obesity, unspecified: E66.9

## 2013-05-26 MED ORDER — SERTRALINE HCL 100 MG PO TABS: 50.0000 mg | ORAL_TABLET | Freq: Every day | ORAL | Status: DC

## 2013-05-26 MED ORDER — BUPROPION HCL ER (XL) 300 MG PO TB24: 300.0000 mg | ORAL_TABLET | Freq: Every day | ORAL | Status: DC

## 2013-05-26 MED ORDER — BUPROPION HCL ER (XL) 150 MG PO TB24: 150.0000 mg | ORAL_TABLET | Freq: Every day | ORAL | Status: DC

## 2013-05-26 NOTE — Assessment & Plan Note (Signed)
Well controlled on current meds, no changes 

## 2013-05-26 NOTE — Assessment & Plan Note (Addendum)
Good hgba1c at last visit, no changes, hgba1c 6.3

## 2013-05-26 NOTE — Assessment & Plan Note (Signed)
Smoking roughly 17 cigarettes daily encouraged ongoing cessation attempts. Did not do well with the patches. Consider Wellbutrin in future

## 2013-05-26 NOTE — Patient Instructions (Signed)
Nicotine Addiction Nicotine can act as both a stimulant (excites/activates) and a sedative (calms/quiets). Immediately after exposure to nicotine, there is a "kick" caused in part by the drug's stimulation of the adrenal glands and resulting discharge of adrenaline (epinephrine). The rush of adrenaline stimulates the body and causes a sudden release of sugar. This means that smokers are always slightly hyperglycemic. Hyperglycemic means that the blood sugar is high, just like in diabetics. Nicotine also decreases the amount of insulin which helps control sugar levels in the body. There is an increase in blood pressure, breathing, and the rate of heart beats.  In addition, nicotine indirectly causes a release of dopamine in the brain that controls pleasure and motivation. A similar reaction is seen with other drugs of abuse, such as cocaine and heroin. This dopamine release is thought to cause the pleasurable sensations when smoking. In some different cases, nicotine can also create a calming effect, depending on sensitivity of the smoker's nervous system and the dose of nicotine taken. WHAT HAPPENS WHEN NICOTINE IS TAKEN FOR LONG PERIODS OF TIME?  Long-term use of nicotine results in addiction. It is difficult to stop.  Repeated use of nicotine creates tolerance. Higher doses of nicotine are needed to get the "kick." When nicotine use is stopped, withdrawal may last a month or more. Withdrawal may begin within a few hours after the last cigarette. Symptoms peak within the first few days and may lessen within a few weeks. For some people, however, symptoms may last for months or longer. Withdrawal symptoms include:   Irritability.  Craving.  Learning and attention deficits.  Sleep disturbances.  Increased appetite. Craving for tobacco may last for 6 months or longer. Many behaviors done while using nicotine can also play a part in the severity of withdrawal symptoms. For some people, the feel,  smell, and sight of a cigarette and the ritual of obtaining, handling, lighting, and smoking the cigarette are closely linked with the pleasure of smoking. When stopped, they also miss the related behaviors which make the withdrawal or craving worse. While nicotine gum and patches may lessen the drug aspects of withdrawal, cravings often persist. WHAT ARE THE MEDICAL CONSEQUENCES OF NICOTINE USE?  Nicotine addiction accounts for one-third of all cancers. The top cancer caused by tobacco is lung cancer. Lung cancer is the number one cancer killer of both men and women.  Smoking is also associated with cancers of the:  Mouth.  Pharynx.  Larynx.  Esophagus.  Stomach.  Pancreas.  Cervix.  Kidney.  Ureter.  Bladder.  Smoking also causes lung diseases such as lasting (chronic) bronchitis and emphysema.  It worsens asthma in adults and children.  Smoking increases the risk of heart disease, including:  Stroke.  Heart attack.  Vascular disease.  Aneurysm.  Passive or secondary smoke can also increase medical risks including:  Asthma in children.  Sudden Infant Death Syndrome (SIDS).  Additionally, dropped cigarettes are the leading cause of residential fire fatalities.  Nicotine poisoning has been reported from accidental ingestion of tobacco products by children and pets. Death usually results in a few minutes from respiratory failure (when a person stops breathing) caused by paralysis. TREATMENT   Medication. Nicotine replacement medicines such as nicotine gum and the patch are used to stop smoking. These medicines gradually lower the dosage of nicotine in the body. These medicines do not contain the carbon monoxide and other toxins found in tobacco smoke.  Hypnotherapy.  Relaxation therapy.  Nicotine Anonymous (a 12-step support   program). Find times and locations in your local yellow pages. Document Released: 03/08/2004 Document Revised: 09/25/2011 Document  Reviewed: 07/31/2007 [employer] Patient Information 2014 [employer].  

## 2013-05-26 NOTE — Progress Notes (Signed)
Patient ID: Peter Adams, male   DOB: January 01, 1946, 67 y.o.   MRN: [id number] Peter Adams [id number] 1945-10-22 05/26/2013      Progress Note-Follow Up  Subjective  Chief Complaint  Chief Complaint  Patient presents with  . Follow-up    6-mth [HTN, DM, Tobacco use, Vit B12 def, PTSD]    HPI  Patient is a 67 yo male who is in today for routine follow up. Is feeling well. No recent illness, HA, congestion or acute concern. No CP/palp/SOB/GI or GU c/o. Taking meds as prescribed. reporblood sugars are well controlled. Denies polyuria or polydipsia  Past Medical History  Diagnosis Date  . Measles as a child  . Mumps as a child  . Chicken pox as a child  . Diabetes mellitus without complication 66 yrs old    type 2  . Hyperlipidemia   . Poor circulation     in legs  . Hypertension   . Asthma   . COPD (chronic obstructive pulmonary disease) 67 yrs old  . Tobacco use 11/21/2012  . PTSD (post-traumatic stress disorder)   . BPH (benign prostatic hyperplasia) 11/21/2012  . Unspecified vitamin D deficiency 11/21/2012  . Vitamin B12 deficiency 11/21/2012  . Skin lesion of left arm 04/27/2013  . Back pain 05/21/2013    Past Surgical History  Procedure Laterality Date  . Appendectomy    . Foot surgery  40 yrs ago    left foot  . Rotator cuff repair  67 yrs old    left shoulder  . Nasal septum surgery  67 yrs old  . Vasectomy  71    Family History  Problem Relation Age of Onset  . Diabetes Mother     type 2  . Hypertension Mother   . Hyperlipidemia Mother   . Stroke Mother   . Diabetes Father     type 2  . Ulcers Father   . Hypertension Father   . Hyperlipidemia Father   . Heart attack Father     X 5  . Cancer Father     around aorta  . Heart disease Sister   . Heart disease Brother   . Diabetes Brother     type 2  . Diabetes Son     type 2  . Diabetes Sister     type 2- diabetic coma  . Diabetes Sister     ?  Marland Kitchen Dementia Sister   . Cancer Brother 50    colon   . Diabetes Brother     type 2  . Leukemia Brother   . Dementia Brother     History   Social History  . Marital Status: Married    Spouse Name: N/A    Number of Children: N/A  . Years of Education: N/A   Occupational History  . Not on file.   Social History Main Topics  . Smoking status: Current Every Day Smoker -- 1.00 packs/day for 55 years    Types: Cigarettes  . Smokeless tobacco: Never Used  . Alcohol Use: Yes     Comment: socially- hardly ever  . Drug Use: No  . Sexual Activity: Not on file   Other Topics Concern  . Not on file   Social History Narrative  . No narrative on file    Current Outpatient Prescriptions on File Prior to Visit  Medication Sig Dispense Refill  . aspirin 81 MG tablet Take 81 mg by mouth daily.      Marland Kitchen  Cholecalciferol (VITAMIN D-3) 1000 UNITS CAPS Take 1 capsule by mouth daily.      . cyanocobalamin 500 MCG tablet Take 500 mcg by mouth daily.      Marland Kitchen gemfibrozil (LOPID) 600 MG tablet Take 600 mg by mouth 2 (two) times daily before a meal.      . glipiZIDE (GLUCOTROL) 10 MG tablet Take 10 mg by mouth 2 (two) times daily before a meal.      . ibuprofen (ADVIL,MOTRIN) 200 MG tablet Take 200 mg by mouth every 6 (six) hours as needed for pain.      Marland Kitchen lisinopril (PRINIVIL,ZESTRIL) 20 MG tablet Take 20 mg by mouth daily.      . metFORMIN (GLUCOPHAGE) 1000 MG tablet Take 1,000 mg by mouth 2 (two) times daily with a meal.      . methocarbamol (ROBAXIN) 500 MG tablet Take 1 tablet (500 mg total) by mouth at bedtime as needed.  30 tablet  1  . methylPREDNIsolone (MEDROL DOSPACK) 4 MG tablet follow package directions  21 tablet  0  . Multiple Vitamin (MULTIVITAMIN) tablet Take 1 tablet by mouth daily.      . Omega-3 Fatty Acids (FISH OIL) 1000 MG CAPS Take 1 capsule by mouth 2 (two) times daily.      . sertraline (ZOLOFT) 100 MG tablet Take 100 mg by mouth daily.      . tamsulosin (FLOMAX) 0.4 MG CAPS Take 0.4 mg by mouth daily.      . traZODone  (DESYREL) 100 MG tablet Take 100 mg by mouth at bedtime.       No current facility-administered medications on file prior to visit.    No Known Allergies  Review of Systems  Review of Systems  Constitutional: Negative for fever and malaise/fatigue.  HENT: Negative for congestion.   Eyes: Negative for discharge.  Respiratory: Negative for shortness of breath.   Cardiovascular: Negative for chest pain, palpitations and leg swelling.  Gastrointestinal: Negative for nausea, abdominal pain and diarrhea.  Genitourinary: Negative for dysuria.  Musculoskeletal: Negative for falls.  Skin: Negative for rash.  Neurological: Negative for loss of consciousness and headaches.  Endo/Heme/Allergies: Negative for polydipsia.  Psychiatric/Behavioral: Negative for depression and suicidal ideas. The patient is not nervous/anxious and does not have insomnia.     Objective  BP 124/62  Pulse 75  Temp(Src) 97.8 F (36.6 C) (Oral)  Resp 18  Ht 5\' 8"  (1.727 m)  Wt 201 lb (91.173 kg)  BMI 30.57 kg/m2  SpO2 98%  Physical Exam  Physical Exam  Constitutional: He is oriented to person, place, and time and well-developed, well-nourished, and in no distress. No distress.  HENT:  Head: Normocephalic and atraumatic.  Eyes: Conjunctivae are normal.  Neck: Neck supple. No thyromegaly present.  Cardiovascular: Normal rate, regular rhythm and normal heart sounds.   No murmur heard. Pulmonary/Chest: Effort normal and breath sounds normal. No respiratory distress.  Abdominal: He exhibits no distension and no mass. There is no tenderness.  Musculoskeletal: He exhibits no edema.  Neurological: He is alert and oriented to person, place, and time.  Skin: Skin is warm.  Psychiatric: Memory, affect and judgment normal.    Lab Results  Component Value Date   TSH 1.417 04/24/2013   Lab Results  Component Value Date   WBC 5.7 04/24/2013   HGB 13.3 04/24/2013   HCT 38.1* 04/24/2013   MCV 93.6 04/24/2013    PLT 196 04/24/2013   Lab Results  Component Value Date  CREATININE 0.71 04/24/2013   BUN 14 04/24/2013   NA 139 04/24/2013   K 4.4 04/24/2013   CL 107 04/24/2013   CO2 25 04/24/2013   Lab Results  Component Value Date   ALT 22 04/24/2013   AST 19 04/24/2013   ALKPHOS 83 04/24/2013   BILITOT 0.4 04/24/2013   Lab Results  Component Value Date   CHOL 138 04/24/2013   Lab Results  Component Value Date   HDL 34* 04/24/2013   Lab Results  Component Value Date   LDLCALC 56 04/24/2013   Lab Results  Component Value Date   TRIG 238* 04/24/2013   Lab Results  Component Value Date   CHOLHDL 4.1 04/24/2013     Assessment & Plan   Back pain Fell prior to last visit. Improving, but still painful. Would like to wait a few more weeks, he will call if he wants a referral to ortho for further consideration  Hypertension Well controlled on current meds, no changes  Diabetes mellitus without complication Good hgba1c at last visit, no changes, hgba1c 6.3  Obesity, unspecified Has lost 87 pounds per patient over the past year or so. Encouraged ongoing efforts is walking and exercising consistently, continue same  Tobacco use Smoking roughly 17 cigarettes daily encouraged ongoing cessation attempts. Did not do well with the patches. Consider Wellbutrin in future   PTSD (post-traumatic stress disorder) Stable on Sertraline continue the same  Hyperlipidemia Mild elevation of triglycerides, minimize simple carbs, increase activity. Will consider changing from lopid to statin at next visit.

## 2013-05-26 NOTE — Assessment & Plan Note (Signed)
Has lost 87 pounds per patient over the past year or so. Encouraged ongoing efforts is walking and exercising consistently, continue same

## 2013-05-26 NOTE — Telephone Encounter (Signed)
Lab order week of 07-21-2013 Labs prior to visit lipid, renal, cbc tsh, hepatic, hgba1c

## 2013-05-26 NOTE — Assessment & Plan Note (Signed)
Larey Seat prior to last visit. Improving, but still painful. Would like to wait a few more weeks, he will call if he wants a referral to ortho for further consideration

## 2013-05-26 NOTE — Assessment & Plan Note (Signed)
Stable on Sertraline continue the same 

## 2013-05-28 NOTE — Assessment & Plan Note (Signed)
Mild elevation of triglycerides, minimize simple carbs, increase activity. Will consider changing from lopid to statin at next visit.

## 2013-05-30 NOTE — Telephone Encounter (Signed)
Lab order placed.

## 2013-07-24 LAB — HEPATIC FUNCTION PANEL
ALT: 27 U/L (ref 0–53)
AST: 28 U/L (ref 0–37)
Albumin: 4.7 g/dL (ref 3.5–5.2)
Alkaline Phosphatase: 70 U/L (ref 39–117)
Bilirubin, Direct: 0.1 mg/dL (ref 0.0–0.3)
Indirect Bilirubin: 0.6 mg/dL (ref 0.0–0.9)
Total Bilirubin: 0.7 mg/dL (ref 0.3–1.2)
Total Protein: 6.8 g/dL (ref 6.0–8.3)

## 2013-07-24 LAB — RENAL FUNCTION PANEL
Albumin: 4.7 g/dL (ref 3.5–5.2)
BUN: 23 mg/dL (ref 6–23)
CO2: 24 mEq/L (ref 19–32)
Calcium: 9.5 mg/dL (ref 8.4–10.5)
Chloride: 103 mEq/L (ref 96–112)
Creat: 0.67 mg/dL (ref 0.50–1.35)
Glucose, Bld: 139 mg/dL — ABNORMAL HIGH (ref 70–99)
Phosphorus: 2.8 mg/dL (ref 2.3–4.6)
Potassium: 4.7 mEq/L (ref 3.5–5.3)
Sodium: 138 mEq/L (ref 135–145)

## 2013-07-24 LAB — CBC
HCT: 40.2 % (ref 39.0–52.0)
Hemoglobin: 13.7 g/dL (ref 13.0–17.0)
MCH: 31.6 pg (ref 26.0–34.0)
MCHC: 34.1 g/dL (ref 30.0–36.0)
MCV: 92.6 fL (ref 78.0–100.0)
Platelets: 194 10*3/uL (ref 150–400)
RBC: 4.34 MIL/uL (ref 4.22–5.81)
RDW: 13.5 % (ref 11.5–15.5)
WBC: 4 10*3/uL (ref 4.0–10.5)

## 2013-07-24 LAB — LIPID PANEL
Cholesterol: 169 mg/dL (ref 0–200)
HDL: 41 mg/dL (ref >39)
LDL Cholesterol: 97 mg/dL (ref 0–99)
Total CHOL/HDL Ratio: 4.1 Ratio
Triglycerides: 153 mg/dL — ABNORMAL HIGH (ref <150)
VLDL: 31 mg/dL (ref 0–40)

## 2013-07-24 LAB — HEMOGLOBIN A1C
Hgb A1c MFr Bld: 6.4 % — ABNORMAL HIGH (ref <5.7)
Mean Plasma Glucose: 137 mg/dL — ABNORMAL HIGH (ref <117)

## 2013-07-25 LAB — TSH: TSH: 1.637 u[IU]/mL (ref 0.350–4.500)

## 2013-07-28 ENCOUNTER — Telehealth: Payer: Self-pay | Admitting: Family Medicine

## 2013-07-28 ENCOUNTER — Encounter: Payer: Self-pay | Admitting: Family Medicine

## 2013-07-28 ENCOUNTER — Ambulatory Visit (INDEPENDENT_AMBULATORY_CARE_PROVIDER_SITE_OTHER): Payer: 59 | Admitting: Family Medicine

## 2013-07-28 VITALS — BP 116/74 | HR 73 | Temp 97.9°F | Ht 68.0 in | Wt 211.1 lb

## 2013-07-28 DIAGNOSIS — E785 Hyperlipidemia, unspecified: Secondary | ICD-10-CM

## 2013-07-28 DIAGNOSIS — E119 Type 2 diabetes mellitus without complications: Secondary | ICD-10-CM

## 2013-07-28 DIAGNOSIS — M25511 Pain in right shoulder: Secondary | ICD-10-CM

## 2013-07-28 DIAGNOSIS — I10 Essential (primary) hypertension: Secondary | ICD-10-CM

## 2013-07-28 DIAGNOSIS — M25519 Pain in unspecified shoulder: Secondary | ICD-10-CM

## 2013-07-28 DIAGNOSIS — J449 Chronic obstructive pulmonary disease, unspecified: Secondary | ICD-10-CM

## 2013-07-28 NOTE — Patient Instructions (Signed)
Adhesive Capsulitis Sometimes the shoulder becomes stiff and is painful to move. Some people say it feels as if the shoulder is frozen in place. Because of this, the condition is called "frozen shoulder." Its medical name is adhesive capsulitis.  The shoulder joint is made up of strong connective tissue that attaches the ball of the humerus to the shallow shoulder socket. This strong connective tissue is called the joint capsule. This tissue can become stiff and swollen. That is when adhesive capsulitis sets in. CAUSES  It is not always clear just what the cause adhesive capsulitis. Possibilities include:  Injury to the shoulder joint.  Strain. This is a repetitive injury brought about by overuse.  Lack of use. Perhaps your arm or hand was otherwise injured. It might have been in a sling for awhile. Or perhaps you were not using it to avoid pain.  Referred pain. This is a sort of trick the body plays. You feel pain in the shoulder. But, the pain actually comes from an injury somewhere else in the body.  Long-standing health problems. Several diseases can cause adhesive capsulitis. They include diabetes, heart disease, stroke, thyroid problems, rheumatoid arthritis and lung disease.  Being a women older than 40. Anyone can develop adhesive capsulitis but it is most common in women in this age group. SYMPTOMS   Pain.  It occurs when the arm is moved.  Parts of the shoulder might hurt if they are touched.  Pain is worse at night or when resting.  Soreness. It might not be strong enough to be called pain. But, the shoulder aches.  The shoulder does not move freely.  Muscle spasms.  Trouble sleeping because of shoulder ache or pain. DIAGNOSIS  To decide if you have adhesive capsulitis, your healthcare provider will probably:  Ask about symptoms you have noticed.  Ask about your history of joint pain and anything that might have caused the pain.  Ask about your overall  health.  Use hands to feel your shoulder and neck.  Ask you to move your shoulder in specific directions. This may indicate the origin of the pain.  Order imaging tests; pictures of the shoulder. They help pinpoint the source of the problem. An X-ray might be used. For more detail, an MRI is often used. An MRI details the tendons, muscles and ligaments as well as the joint. TREATMENT  Adhesive capsulitis can be treated several ways. Most treatments can be done in a clinic or in your healthcare provider's office. Be sure to discuss the different options with your caregiver. They include:  Physical therapy. You will work on specific exercises to get your shoulder moving again. The exercises usually involve stretching. A physical therapist (a caregiver with special training) can show you what to do and what not to do. The exercises will need to be done daily.  Medication.  Over-the-counter medicines may relieve pain and inflammation (the body's way of reacting to injury or infection).  Corticosteroids. These are stronger drugs to reduce pain and inflammation. They are given by injection (shots) into the shoulder joint. Frequent treatment is not recommended.  Muscle relaxants. Medication may be prescribed to ease muscle spasms.  Treatment of underlying conditions. This means treating another condition that is causing your shoulder problem. This might be a rotator cuff (tendon) problem  Shoulder manipulation. The shoulder will be moved by your healthcare provider. You would be under general anesthesia (given a drug that puts you to sleep). You would not feel anything. Sometimes   the joint will be injected with salt water (saline) at high pressure to break down internal scarring in the joint capsule.  Surgery. This is rarely needed. It may be suggested in advanced cases after all other treatment has failed. PROGNOSIS  In time, most people recover from adhesive capsulitis. Sometimes, however, the  pain goes away but full movement of the shoulder does not return.  HOME CARE INSTRUCTIONS   Take any pain medications recommended by your healthcare provider. Follow the directions carefully.  If you have physical therapy, follow through with the therapist's suggestions. Be sure you understand the exercises you will be doing. You should understand:  How often the exercises should be done.  How many times each exercise should be repeated.  How long they should be done.  What other activities you should do, or not do.  That you should warm up before doing any exercise. Just 5 to 10 minutes will help. Small, gentle movements should get your shoulder ready for more.  Avoid high-demand exercise that involves your shoulder such as throwing. This type of exercise can make pain worse.  Consider using cold packs. Cold may ease swelling and pain. Ask your healthcare provider if a cold pack might help you. If so, get directions on how and when to use them. SEEK MEDICAL CARE IF:   You have any questions about your medications.  Your pain continues to increase. Document Released: 04/30/2009 Document Revised: 09/25/2011 Document Reviewed: 04/30/2009 [employer] Patient Information 2014 [employer].  

## 2013-07-28 NOTE — Progress Notes (Signed)
Patient ID: Peter Adams, male   DOB: 05/14/1946, 68 y.o.   MRN: [id number] MAGGIE DWORKIN [id number] Apr 18, 1946 07/28/2013      Progress Note-Follow Up  Subjective  Chief Complaint  Chief Complaint  Patient presents with  . Follow-up    2 month    HPI  Patient is a 68 year old male who is in today for followup. He did struggle with right shoulder trouble for quite some time. He has previously had left shoulder for this for her and decreased range of motion the right shoulder. Denies falls or acute injury. No chest pain, palpitations or shortness of breath no GI or GU complaints  Past Medical History  Diagnosis Date  . Measles as a child  . Mumps as a child  . Chicken pox as a child  . 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 Hyperlipidemia   . Poor circulation     in legs  . Hypertension   . Asthma   . COPD (chronic obstructive pulmonary disease) 68 yrs old  . Tobacco use 11/21/2012  . PTSD (post-traumatic stress disorder)   . BPH (benign prostatic hyperplasia) 11/21/2012  . Unspecified vitamin D deficiency 11/21/2012  . Vitamin B12 deficiency 11/21/2012  . Skin lesion of left arm 04/27/2013  . Back pain 05/21/2013  . Obesity, unspecified 05/26/2013    Past Surgical History  Procedure Laterality Date  . Appendectomy    . Foot surgery  40 yrs ago    left foot  . Rotator cuff repair  68 yrs old    left shoulder  . Nasal septum surgery  68 yrs old  . Vasectomy  62    Family History  Problem Relation Age of Onset  . Diabetes Mother     type 2  . Hypertension Mother   . Hyperlipidemia Mother   . Stroke Mother   . Diabetes Father     type 2  . Ulcers Father   . Hypertension Father   . Hyperlipidemia Father   . Heart attack Father     X 5  . Cancer Father     around aorta  . Heart disease Sister   . Heart disease Brother   . Diabetes Brother     type 2  . Diabetes Son     type 2  . Diabetes Sister     type 2- diabetic coma  . Diabetes  Sister     ?  Marland Kitchen Dementia Sister   . Cancer Brother 50    colon  . Diabetes Brother     type 2  . Leukemia Brother   . Dementia Brother     History   Social History  . Marital Status: Married    Spouse Name: N/A    Number of Children: N/A  . Years of Education: N/A   Occupational History  . Not on file.   Social History Main Topics  . Smoking status: Former Smoker -- 1.00 packs/day for 55 years    Types: Cigarettes    Quit date: 05/28/2013  . Smokeless tobacco: Never Used  . Alcohol Use: Yes     Comment: socially- hardly ever  . Drug Use: No  . Sexual Activity: Not on file   Other Topics Concern  . Not on file   Social History Narrative  . No narrative on file    Current Outpatient Prescriptions on File Prior to Visit  Medication Sig Dispense Refill  .  aspirin 81 MG tablet Take 81 mg by mouth daily.      . Cholecalciferol (VITAMIN D-3) 1000 UNITS CAPS Take 1 capsule by mouth daily.      . cyanocobalamin 500 MCG tablet Take 500 mcg by mouth daily.      Marland Kitchen gemfibrozil (LOPID) 600 MG tablet Take 600 mg by mouth 2 (two) times daily before a meal.      . glipiZIDE (GLUCOTROL) 10 MG tablet Take 10 mg by mouth 2 (two) times daily before a meal.      . ibuprofen (ADVIL,MOTRIN) 200 MG tablet Take 200 mg by mouth every 6 (six) hours as needed for pain.      Marland Kitchen lisinopril (PRINIVIL,ZESTRIL) 20 MG tablet Take 20 mg by mouth daily.      . metFORMIN (GLUCOPHAGE) 1000 MG tablet Take 1,000 mg by mouth 2 (two) times daily with a meal.      . methocarbamol (ROBAXIN) 500 MG tablet Take 1 tablet (500 mg total) by mouth at bedtime as needed.  30 tablet  1  . Multiple Vitamin (MULTIVITAMIN) tablet Take 1 tablet by mouth daily.      . Omega-3 Fatty Acids (FISH OIL) 1000 MG CAPS Take 1 capsule by mouth 2 (two) times daily.      . sertraline (ZOLOFT) 100 MG tablet Take 0.5 tablets (50 mg total) by mouth daily.      . tamsulosin (FLOMAX) 0.4 MG CAPS Take 0.4 mg by mouth daily.      .  traZODone (DESYREL) 100 MG tablet Take 100 mg by mouth at bedtime.       No current facility-administered medications on file prior to visit.    No Known Allergies  Review of Systems  Review of Systems  Constitutional: Negative for fever and malaise/fatigue.  HENT: Negative for congestion.   Eyes: Negative for discharge.  Respiratory: Negative for shortness of breath.   Cardiovascular: Negative for chest pain, palpitations and leg swelling.  Gastrointestinal: Negative for nausea, abdominal pain and diarrhea.  Genitourinary: Negative for dysuria.  Musculoskeletal: Negative for falls.  Skin: Negative for rash.  Neurological: Negative for loss of consciousness and headaches.  Endo/Heme/Allergies: Negative for polydipsia.  Psychiatric/Behavioral: Negative for depression and suicidal ideas. The patient is not nervous/anxious and does not have insomnia.     Objective  BP 116/74  Pulse 73  Temp(Src) 97.9 F (36.6 C) (Oral)  Ht 5\' 8"  (1.727 m)  Wt 211 lb 1.3 oz (95.745 kg)  BMI 32.10 kg/m2  SpO2 98%  Physical Exam  Physical Exam  Constitutional: He is oriented to person, place, and time and well-developed, well-nourished, and in no distress. No distress.  HENT:  Head: Normocephalic and atraumatic.  Eyes: Conjunctivae are normal.  Neck: Neck supple. No thyromegaly present.  Cardiovascular: Normal rate, regular rhythm and normal heart sounds.   No murmur heard. Pulmonary/Chest: Effort normal and breath sounds normal. No respiratory distress.  Abdominal: He exhibits no distension and no mass. There is no tenderness.  Musculoskeletal: He exhibits no edema.  Neurological: He is alert and oriented to person, place, and time.  Skin: Skin is warm.  Psychiatric: Memory, affect and judgment normal.    Lab Results  Component Value Date   TSH 1.637 07/24/2013   Lab Results  Component Value Date   WBC 4.0 07/24/2013   HGB 13.7 07/24/2013   HCT 40.2 07/24/2013   MCV 92.6 07/24/2013    PLT 194 07/24/2013   Lab Results  Component Value  Date   CREATININE 0.67 07/24/2013   BUN 23 07/24/2013   NA 138 07/24/2013   K 4.7 07/24/2013   CL 103 07/24/2013   CO2 24 07/24/2013   Lab Results  Component Value Date   ALT 27 07/24/2013   AST 28 07/24/2013   ALKPHOS 70 07/24/2013   BILITOT 0.7 07/24/2013   Lab Results  Component Value Date   CHOL 169 07/24/2013   Lab Results  Component Value Date   HDL 41 07/24/2013   Lab Results  Component Value Date   LDLCALC 97 07/24/2013   Lab Results  Component Value Date   TRIG 153* 07/24/2013   Lab Results  Component Value Date   CHOLHDL 4.1 07/24/2013     Assessment & Plan  COPD (chronic obstructive pulmonary disease) Doing well no cigarettes in 2 months.  Hypertension Well controlled, no changes. Minimize sodium  Diabetes mellitus without complication He reports his sugars have been well controlled,  Minimize simple carbs and continue current meds  Hyperlipidemia Avoid trans fats, continue Gemfibrozil, start krill oil caps  Right shoulder pain Encouraged topical treatments and referred to sports medicine for further consideration

## 2013-07-28 NOTE — Progress Notes (Signed)
Pre visit review using our clinic review tool, if applicable. No additional management support is needed unless otherwise documented below in the visit note. 

## 2013-07-28 NOTE — Telephone Encounter (Signed)
Labs prior to visit lipid, renal, cbc, hepatic, hgba1c, tsh   Patient will be going to [facility]

## 2013-07-30 ENCOUNTER — Encounter: Payer: Self-pay | Admitting: Family Medicine

## 2013-07-30 DIAGNOSIS — M25511 Pain in right shoulder: Secondary | ICD-10-CM

## 2013-07-30 HISTORY — DX: Pain in right shoulder: M25.511

## 2013-07-30 NOTE — Assessment & Plan Note (Signed)
Well controlled, no changes. Minimize sodium

## 2013-07-30 NOTE — Assessment & Plan Note (Signed)
Avoid trans fats, continue Gemfibrozil, start krill oil caps

## 2013-07-30 NOTE — Assessment & Plan Note (Signed)
Doing well no cigarettes in 2 months.

## 2013-07-30 NOTE — Assessment & Plan Note (Addendum)
He reports his sugars have been well controlled,  Minimize simple carbs and continue current meds

## 2013-07-30 NOTE — Assessment & Plan Note (Signed)
Encouraged topical treatments and referred to sports medicine for further consideration

## 2013-08-01 NOTE — Telephone Encounter (Signed)
Lab order placed.

## 2013-08-13 ENCOUNTER — Ambulatory Visit (INDEPENDENT_AMBULATORY_CARE_PROVIDER_SITE_OTHER): Payer: 59 | Admitting: Family Medicine

## 2013-08-13 ENCOUNTER — Encounter: Payer: Self-pay | Admitting: Family Medicine

## 2013-08-13 VITALS — BP 129/73 | HR 74 | Ht 68.0 in | Wt 198.0 lb

## 2013-08-13 DIAGNOSIS — M25519 Pain in unspecified shoulder: Secondary | ICD-10-CM

## 2013-08-13 DIAGNOSIS — M25511 Pain in right shoulder: Secondary | ICD-10-CM

## 2013-08-13 MED ORDER — MELOXICAM 15 MG PO TABS: 15.0000 mg | ORAL_TABLET | Freq: Every day | ORAL | Status: DC

## 2013-08-13 NOTE — Patient Instructions (Signed)
You have a frozen shoulder (adhesive capsulitis), a buildup of scar tissue that limits motion of the shoulder joint, as well as rotator cuff tendinitis. Limit lifting and overhead activities as much as possible. Heat 15 minutes at a time 3-4 times a day may help with movement and stiffness. Meloxicam 15mg  daily with food for pain and inflammation. Steroid injections in a series have been shown to help with pain and motion - consider if not improving. Codman exercises (pendulum, wall walking or table slides, arm circles) - do 3 sets of 15 once or twice daily of each of these. Physical therapy for rotator cuff strengthening is a consideration once you are out of the painful phase Follow up in 6 weeks

## 2013-08-17 ENCOUNTER — Encounter: Payer: Self-pay | Admitting: Family Medicine

## 2013-08-17 NOTE — Progress Notes (Signed)
Patient ID: UMER HARIG, male   DOB: 1945-11-22, 68 y.o.   MRN: [id number]  PCP: Danise Edge, MD  Subjective:   HPI: Patient is a 68 y.o. male here for right shoulder pain.  Patient reports for about 4 months she's had right sided shoulder pain radiating down to elbow. Limited range of motion. No obvious injury. Pain worse lying on this shoulder. Is right handed. Not taking any medications. Not waking her up at night.  Past Medical History  Diagnosis Date  . Measles as a child  . Mumps as a child  . Chicken pox as a child  . 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 Hyperlipidemia   . Poor circulation     in legs  . Hypertension   . Asthma   . COPD (chronic obstructive pulmonary disease) 68 yrs old  . Tobacco use 11/21/2012  . PTSD (post-traumatic stress disorder)   . BPH (benign prostatic hyperplasia) 11/21/2012  . Unspecified vitamin D deficiency 11/21/2012  . Vitamin B12 deficiency 11/21/2012  . Skin lesion of left arm 04/27/2013  . Back pain 05/21/2013  . Obesity, unspecified 05/26/2013  . Right shoulder pain 07/30/2013    Current Outpatient Prescriptions on File Prior to Visit  Medication Sig Dispense Refill  . aspirin 81 MG tablet Take 81 mg by mouth daily.      . Cholecalciferol (VITAMIN D-3) 1000 UNITS CAPS Take 1 capsule by mouth daily.      . cyanocobalamin 500 MCG tablet Take 500 mcg by mouth daily.      Marland Kitchen gemfibrozil (LOPID) 600 MG tablet Take 600 mg by mouth 2 (two) times daily before a meal.      . glipiZIDE (GLUCOTROL) 10 MG tablet Take 10 mg by mouth 2 (two) times daily before a meal.      . ibuprofen (ADVIL,MOTRIN) 200 MG tablet Take 200 mg by mouth every 6 (six) hours as needed for pain.      Marland Kitchen lisinopril (PRINIVIL,ZESTRIL) 20 MG tablet Take 20 mg by mouth daily.      . metFORMIN (GLUCOPHAGE) 1000 MG tablet Take 1,000 mg by mouth 2 (two) times daily with a meal.      . methocarbamol (ROBAXIN) 500 MG tablet Take 1 tablet (500 mg total)  by mouth at bedtime as needed.  30 tablet  1  . Multiple Vitamin (MULTIVITAMIN) tablet Take 1 tablet by mouth daily.      . Omega-3 Fatty Acids (FISH OIL) 1000 MG CAPS Take 1 capsule by mouth 2 (two) times daily.      . sertraline (ZOLOFT) 100 MG tablet Take 0.5 tablets (50 mg total) by mouth daily.      . tamsulosin (FLOMAX) 0.4 MG CAPS Take 0.4 mg by mouth daily.      . traZODone (DESYREL) 100 MG tablet Take 100 mg by mouth at bedtime.       No current facility-administered medications on file prior to visit.    Past Surgical History  Procedure Laterality Date  . Appendectomy    . Foot surgery  40 yrs ago    left foot  . Rotator cuff repair  68 yrs old    left shoulder  . Nasal septum surgery  68 yrs old  . Vasectomy  42    No Known Allergies  History   Social History  . Marital Status: Married    Spouse Name: N/A    Number of Children: N/A  .  Years of Education: N/A   Occupational History  . Not on file.   Social History Main Topics  . Smoking status: Former Smoker -- 1.00 packs/day for 55 years    Types: Cigarettes    Quit date: 05/28/2013  . Smokeless tobacco: Never Used  . Alcohol Use: Yes     Comment: socially- hardly ever  . Drug Use: No  . Sexual Activity: Not on file   Other Topics Concern  . Not on file   Social History Narrative  . No narrative on file    Family History  Problem Relation Age of Onset  . Diabetes Mother     type 2  . Hypertension Mother   . Hyperlipidemia Mother   . Stroke Mother   . Diabetes Father     type 2  . Ulcers Father   . Hypertension Father   . Hyperlipidemia Father   . Heart attack Father     X 5  . Cancer Father     around aorta  . Heart disease Sister   . Heart disease Brother   . Diabetes Brother     type 2  . Diabetes Son     type 2  . Diabetes Sister     type 2- diabetic coma  . Diabetes Sister     ?  Marland Kitchen. Dementia Sister   . Cancer Brother 50    colon  . Diabetes Brother     type 2  . Leukemia  Brother   . Dementia Brother     BP 129/73  Pulse 74  Ht 5\' 8"  (1.727 m)  Wt 198 lb (89.812 kg)  BMI 30.11 kg/m2  Review of Systems: See HPI above.    Objective:  Physical Exam:  Gen: NAD  Right shoulder: No swelling, ecchymoses.  No gross deformity. No TTP AC joint, biceps tendon. Full ER but only abducts/flexes to 100 degrees even passively. Positive Hawkins, Neers. Negative Speeds, Yergasons. Strength 5/5 with empty can and resisted internal/external rotation. Negative apprehension. NV intact distally.    Assessment & Plan:  1. Right shoulder pain - 2/2 adhesive capsulitis, rotator cuff tendinitis.  Shown codman exercises to do daily.  Heat, mobic.  Avoid painful activities (overhead lifting, reaching repetitively).  Consider intraarticular injections in series of 3 if not improving.  F/u in 6 weeks.  Consider PT if improving.

## 2013-08-17 NOTE — Assessment & Plan Note (Signed)
2/2 adhesive capsulitis, rotator cuff tendinitis.  Shown codman exercises to do daily.  Heat, mobic.  Avoid painful activities (overhead lifting, reaching repetitively).  Consider intraarticular injections in series of 3 if not improving.  F/u in 6 weeks.  Consider PT if improving.

## 2013-08-27 ENCOUNTER — Ambulatory Visit (INDEPENDENT_AMBULATORY_CARE_PROVIDER_SITE_OTHER): Payer: 59 | Admitting: Family Medicine

## 2013-08-27 ENCOUNTER — Encounter: Payer: Self-pay | Admitting: Family Medicine

## 2013-08-27 VITALS — BP 126/75 | HR 72 | Ht 68.0 in | Wt 197.0 lb

## 2013-08-27 DIAGNOSIS — M25511 Pain in right shoulder: Secondary | ICD-10-CM

## 2013-08-27 DIAGNOSIS — M25519 Pain in unspecified shoulder: Secondary | ICD-10-CM

## 2013-08-28 ENCOUNTER — Encounter: Payer: Self-pay | Admitting: Family Medicine

## 2013-08-28 NOTE — Progress Notes (Signed)
Patient ID: Peter Adams, male   DOB: Feb 22, 1946, 68 y.o.   MRN: [id number]  PCP: Danise EdgeBLYTH, STACEY, MD  Subjective:   HPI: Patient is a 68 y.o. male here for right shoulder pain.  1/28: Patient reports for about 4 months she's had right sided shoulder pain radiating down to elbow. Limited range of motion. No obvious injury. Pain worse lying on this shoulder. Is right handed. Not taking any medications. Not waking him up at night.  2/11: Patient returns as shoulder pain is not improving, would like to pursue injections. Doing codman exercises. Taking meloxicam.  Past Medical History  Diagnosis Date  . Measles as a child  . Mumps as a child  . Chicken pox as a child  . 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 Hyperlipidemia   . Poor circulation     in legs  . Hypertension   . Asthma   . COPD (chronic obstructive pulmonary disease) 68 yrs old  . Tobacco use 11/21/2012  . PTSD (post-traumatic stress disorder)   . BPH (benign prostatic hyperplasia) 11/21/2012  . Unspecified vitamin D deficiency 11/21/2012  . Vitamin B12 deficiency 11/21/2012  . Skin lesion of left arm 04/27/2013  . Back pain 05/21/2013  . Obesity, unspecified 05/26/2013  . Right shoulder pain 07/30/2013    Current Outpatient Prescriptions on File Prior to Visit  Medication Sig Dispense Refill  . aspirin 81 MG tablet Take 81 mg by mouth daily.      Marland Kitchen. buPROPion (WELLBUTRIN XL) 150 MG 24 hr tablet       . Cholecalciferol (VITAMIN D-3) 1000 UNITS CAPS Take 1 capsule by mouth daily.      . cyanocobalamin 500 MCG tablet Take 500 mcg by mouth daily.      Marland Kitchen. gemfibrozil (LOPID) 600 MG tablet Take 600 mg by mouth 2 (two) times daily before a meal.      . glipiZIDE (GLUCOTROL) 10 MG tablet Take 10 mg by mouth 2 (two) times daily before a meal.      . ibuprofen (ADVIL,MOTRIN) 200 MG tablet Take 200 mg by mouth every 6 (six) hours as needed for pain.      Marland Kitchen. lisinopril (PRINIVIL,ZESTRIL) 20 MG tablet  Take 20 mg by mouth daily.      . meloxicam (MOBIC) 15 MG tablet Take 1 tablet (15 mg total) by mouth daily. With food.  30 tablet  1  . metFORMIN (GLUCOPHAGE) 1000 MG tablet Take 1,000 mg by mouth 2 (two) times daily with a meal.      . methocarbamol (ROBAXIN) 500 MG tablet Take 1 tablet (500 mg total) by mouth at bedtime as needed.  30 tablet  1  . Multiple Vitamin (MULTIVITAMIN) tablet Take 1 tablet by mouth daily.      . Omega-3 Fatty Acids (FISH OIL) 1000 MG CAPS Take 1 capsule by mouth 2 (two) times daily.      . sertraline (ZOLOFT) 100 MG tablet Take 0.5 tablets (50 mg total) by mouth daily.      . tamsulosin (FLOMAX) 0.4 MG CAPS Take 0.4 mg by mouth daily.      . traZODone (DESYREL) 100 MG tablet Take 100 mg by mouth at bedtime.       No current facility-administered medications on file prior to visit.    Past Surgical History  Procedure Laterality Date  . Appendectomy    . Foot surgery  40 yrs ago    left  foot  . Rotator cuff repair  68 yrs old    left shoulder  . Nasal septum surgery  68 yrs old  . Vasectomy  42    No Known Allergies  History   Social History  . Marital Status: Married    Spouse Name: N/A    Number of Children: N/A  . Years of Education: N/A   Occupational History  . Not on file.   Social History Main Topics  . Smoking status: Former Smoker -- 1.00 packs/day for 55 years    Types: Cigarettes    Quit date: 05/28/2013  . Smokeless tobacco: Never Used  . Alcohol Use: Yes     Comment: socially- hardly ever  . Drug Use: No  . Sexual Activity: Not on file   Other Topics Concern  . Not on file   Social History Narrative  . No narrative on file    Family History  Problem Relation Age of Onset  . Diabetes Mother     type 2  . Hypertension Mother   . Hyperlipidemia Mother   . Stroke Mother   . Diabetes Father     type 2  . Ulcers Father   . Hypertension Father   . Hyperlipidemia Father   . Heart attack Father     X 5  . Cancer  Father     around aorta  . Heart disease Sister   . Heart disease Brother   . Diabetes Brother     type 2  . Diabetes Son     type 2  . Diabetes Sister     type 2- diabetic coma  . Diabetes Sister     ?  Marland Kitchen Dementia Sister   . Cancer Brother 50    colon  . Diabetes Brother     type 2  . Leukemia Brother   . Dementia Brother     BP 126/75  Pulse 72  Ht 5\' 8"  (1.727 m)  Wt 197 lb (89.359 kg)  BMI 29.96 kg/m2  Review of Systems: See HPI above.    Objective:  Physical Exam:  Gen: NAD  Right shoulder: No swelling, ecchymoses.  No gross deformity. No TTP AC joint, biceps tendon. Full ER but only abducts/flexes to 100 degrees even passively. Positive Hawkins, Neers. Negative Speeds, Yergasons. Strength 5/5 with empty can and resisted internal/external rotation. Negative apprehension. NV intact distally.    Assessment & Plan:  1. Right shoulder pain - 2/2 adhesive capsulitis, rotator cuff tendinitis.  Combination injection given today.  Continue codman exercises, heat, mobic.  Avoid painful activities (overhead lifting, reaching repetitively).  F/u 6 weeks.  After informed written consent, patient was seated on exam table. Right shoulder was prepped with alcohol swab and utilizing posterior approach, patient's right shoulder was injected with 6:2 marcaine:depomedrol with half in the subacromial space and half in glenohumeral space.  Patient tolerated the procedure well without immediate complications.

## 2013-08-28 NOTE — Assessment & Plan Note (Signed)
2/2 adhesive capsulitis, rotator cuff tendinitis.  Combination injection given today.  Continue codman exercises, heat, mobic.  Avoid painful activities (overhead lifting, reaching repetitively).  F/u 6 weeks.  After informed written consent, patient was seated on exam table. Right shoulder was prepped with alcohol swab and utilizing posterior approach, patient's right shoulder was injected with 6:2 marcaine:depomedrol with half in the subacromial space and half in glenohumeral space.  Patient tolerated the procedure well without immediate complications.

## 2013-09-24 ENCOUNTER — Ambulatory Visit: Payer: 59 | Admitting: Family Medicine

## 2013-10-07 ENCOUNTER — Other Ambulatory Visit: Payer: Self-pay | Admitting: Family Medicine

## 2013-10-08 ENCOUNTER — Encounter: Payer: Self-pay | Admitting: Family Medicine

## 2013-10-08 ENCOUNTER — Ambulatory Visit (INDEPENDENT_AMBULATORY_CARE_PROVIDER_SITE_OTHER): Payer: 59 | Admitting: Family Medicine

## 2013-10-08 ENCOUNTER — Telehealth: Payer: Self-pay

## 2013-10-08 ENCOUNTER — Ambulatory Visit: Payer: 59 | Admitting: Family Medicine

## 2013-10-08 VITALS — BP 133/80 | HR 77 | Ht 68.0 in | Wt 217.0 lb

## 2013-10-08 DIAGNOSIS — M25511 Pain in right shoulder: Secondary | ICD-10-CM

## 2013-10-08 DIAGNOSIS — M25519 Pain in unspecified shoulder: Secondary | ICD-10-CM

## 2013-10-08 MED ORDER — MELOXICAM 15 MG PO TABS: 15.0000 mg | ORAL_TABLET | Freq: Every day | ORAL | Status: DC

## 2013-10-08 NOTE — Telephone Encounter (Signed)
Please have him take an extra 1/2 of a Metformin tab daily for the next 3-5 days secondary to just having had a steroid shot. After that his numbers should look better. Can take Metfromin 1000mg  morning and night and 500 mg at noon.

## 2013-10-08 NOTE — Telephone Encounter (Signed)
Left message with Corrie DandyMary to have pt return my call.

## 2013-10-08 NOTE — Telephone Encounter (Signed)
Notified pt and he voices understanding. 

## 2013-10-08 NOTE — Telephone Encounter (Signed)
[facility] for [facility].  The patients urine glucose was 4+ and random BS 196.  Stated the patient did have a steroid injection for his shoulder today 10/08/13.  Call back number for this patient if need to follow-up is [phone].

## 2013-10-13 ENCOUNTER — Encounter: Payer: Self-pay | Admitting: Family Medicine

## 2013-10-13 NOTE — Progress Notes (Signed)
Patient ID: Peter Adams, male   DOB: 15-Nov-1945, 68 y.o.   MRN: [id number]  PCP: Danise EdgeBLYTH, STACEY, MD  Subjective:   HPI: Patient is a 68 y.o. male here for right shoulder pain.  1/28: Patient reports for about 4 months she's had right sided shoulder pain radiating down to elbow. Limited range of motion. No obvious injury. Pain worse lying on this shoulder. Is right handed. Not taking any medications. Not waking him up at night.  2/11: Patient returns as shoulder pain is not improving, would like to pursue injections. Doing codman exercises. Taking meloxicam.  3/25: Patient reports he has improved since last visit. Injection and mobic have helped. Doing PT with a personal trainer 3 times a week and doing HEP also. Motion and pain improved. Some night pain still.  Past Medical History  Diagnosis Date  . Measles as a child  . Mumps as a child  . Chicken pox as a child  . 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 Hyperlipidemia   . Poor circulation     in legs  . Hypertension   . Asthma   . COPD (chronic obstructive pulmonary disease) 68 yrs old  . Tobacco use 11/21/2012  . PTSD (post-traumatic stress disorder)   . BPH (benign prostatic hyperplasia) 11/21/2012  . Unspecified vitamin D deficiency 11/21/2012  . Vitamin B12 deficiency 11/21/2012  . Skin lesion of left arm 04/27/2013  . Back pain 05/21/2013  . Obesity, unspecified 05/26/2013  . Right shoulder pain 07/30/2013    Current Outpatient Prescriptions on File Prior to Visit  Medication Sig Dispense Refill  . aspirin 81 MG tablet Take 81 mg by mouth daily.      Marland Kitchen. buPROPion (WELLBUTRIN XL) 150 MG 24 hr tablet       . Cholecalciferol (VITAMIN D-3) 1000 UNITS CAPS Take 1 capsule by mouth daily.      . cyanocobalamin 500 MCG tablet Take 500 mcg by mouth daily.      Marland Kitchen. gemfibrozil (LOPID) 600 MG tablet Take 600 mg by mouth 2 (two) times daily before a meal.      . glipiZIDE (GLUCOTROL) 10 MG tablet  Take 10 mg by mouth 2 (two) times daily before a meal.      . ibuprofen (ADVIL,MOTRIN) 200 MG tablet Take 200 mg by mouth every 6 (six) hours as needed for pain.      Marland Kitchen. lisinopril (PRINIVIL,ZESTRIL) 20 MG tablet Take 20 mg by mouth daily.      . metFORMIN (GLUCOPHAGE) 1000 MG tablet Take 1,000 mg by mouth 2 (two) times daily with a meal.      . methocarbamol (ROBAXIN) 500 MG tablet Take 1 tablet (500 mg total) by mouth at bedtime as needed.  30 tablet  1  . Multiple Vitamin (MULTIVITAMIN) tablet Take 1 tablet by mouth daily.      . Omega-3 Fatty Acids (FISH OIL) 1000 MG CAPS Take 1 capsule by mouth 2 (two) times daily.      . sertraline (ZOLOFT) 100 MG tablet Take 0.5 tablets (50 mg total) by mouth daily.      . tamsulosin (FLOMAX) 0.4 MG CAPS Take 0.4 mg by mouth daily.      . traZODone (DESYREL) 100 MG tablet Take 100 mg by mouth at bedtime.       No current facility-administered medications on file prior to visit.    Past Surgical History  Procedure Laterality Date  . Appendectomy    .  Foot surgery  40 yrs ago    left foot  . Rotator cuff repair  68 yrs old    left shoulder  . Nasal septum surgery  68 yrs old  . Vasectomy  42    No Known Allergies  History   Social History  . Marital Status: Married    Spouse Name: N/A    Number of Children: N/A  . Years of Education: N/A   Occupational History  . Not on file.   Social History Main Topics  . Smoking status: Former Smoker -- 1.00 packs/day for 55 years    Types: Cigarettes    Quit date: 05/28/2013  . Smokeless tobacco: Never Used  . Alcohol Use: Yes     Comment: socially- hardly ever  . Drug Use: No  . Sexual Activity: Not on file   Other Topics Concern  . Not on file   Social History Narrative  . No narrative on file    Family History  Problem Relation Age of Onset  . Diabetes Mother     type 2  . Hypertension Mother   . Hyperlipidemia Mother   . Stroke Mother   . Diabetes Father     type 2  . Ulcers  Father   . Hypertension Father   . Hyperlipidemia Father   . Heart attack Father     X 5  . Cancer Father     around aorta  . Heart disease Sister   . Heart disease Brother   . Diabetes Brother     type 2  . Diabetes Son     type 2  . Diabetes Sister     type 2- diabetic coma  . Diabetes Sister     ?  Marland Kitchen Dementia Sister   . Cancer Brother 50    colon  . Diabetes Brother     type 2  . Leukemia Brother   . Dementia Brother     BP 133/80  Pulse 77  Ht 5\' 8"  (1.727 m)  Wt 217 lb (98.431 kg)  BMI 33.00 kg/m2  Review of Systems: See HPI above.    Objective:  Physical Exam:  Gen: NAD  Right shoulder: No swelling, ecchymoses.  No gross deformity. No TTP AC joint, biceps tendon. Full ER with abduction/flexion to 110 degrees, less painful. Minimally positive Hawkins, Neers. Negative Yergasons. Strength 5/5 with empty can and resisted internal/external rotation. Negative apprehension. NV intact distally.    Assessment & Plan:  1. Right shoulder pain - 2/2 adhesive capsulitis, rotator cuff tendinitis.  Repeat intraarticular injection today.  Consider third and final intraarticular injection at f/u in 6 weeks.  Continue codman exercises, heat, mobic.  Avoid painful activities (overhead lifting, reaching repetitively).  F/u 6 weeks.  After informed written consent, patient was seated on exam table. Right shoulder was prepped with alcohol swab and utilizing posterior approach, patient's right glenohumeral joint was injected with 3:1 marcaine:depomedrol. Patient tolerated the procedure well without immediate complications.

## 2013-10-13 NOTE — Assessment & Plan Note (Signed)
2/2 adhesive capsulitis, rotator cuff tendinitis.  Repeat intraarticular injection today.  Consider third and final intraarticular injection at f/u in 6 weeks.  Continue codman exercises, heat, mobic.  Avoid painful activities (overhead lifting, reaching repetitively).  F/u 6 weeks.  After informed written consent, patient was seated on exam table. Right shoulder was prepped with alcohol swab and utilizing posterior approach, patient's right glenohumeral joint was injected with 3:1 marcaine:depomedrol. Patient tolerated the procedure well without immediate complications.

## 2013-11-04 LAB — HEPATIC FUNCTION PANEL
ALBUMIN: 4.7 g/dL (ref 3.5–5.2)
ALK PHOS: 84 U/L (ref 39–117)
ALT: 20 U/L (ref 0–53)
AST: 18 U/L (ref 0–37)
BILIRUBIN INDIRECT: 0.4 mg/dL (ref 0.2–1.2)
Bilirubin, Direct: 0.1 mg/dL (ref 0.0–0.3)
TOTAL PROTEIN: 6.9 g/dL (ref 6.0–8.3)
Total Bilirubin: 0.5 mg/dL (ref 0.2–1.2)

## 2013-11-04 LAB — TSH: TSH: 1.9 u[IU]/mL (ref 0.350–4.500)

## 2013-11-04 LAB — LIPID PANEL
CHOL/HDL RATIO: 4 ratio
Cholesterol: 181 mg/dL (ref 0–200)
HDL: 45 mg/dL (ref >39)
LDL Cholesterol: 108 mg/dL — ABNORMAL HIGH (ref 0–99)
Triglycerides: 140 mg/dL (ref <150)
VLDL: 28 mg/dL (ref 0–40)

## 2013-11-04 LAB — CBC
HCT: 40.3 % (ref 39.0–52.0)
Hemoglobin: 13.9 g/dL (ref 13.0–17.0)
MCH: 30.5 pg (ref 26.0–34.0)
MCHC: 34.5 g/dL (ref 30.0–36.0)
MCV: 88.4 fL (ref 78.0–100.0)
PLATELETS: 186 10*3/uL (ref 150–400)
RBC: 4.56 MIL/uL (ref 4.22–5.81)
RDW: 13.7 % (ref 11.5–15.5)
WBC: 4 10*3/uL (ref 4.0–10.5)

## 2013-11-04 LAB — RENAL FUNCTION PANEL
Albumin: 4.7 g/dL (ref 3.5–5.2)
BUN: 25 mg/dL — ABNORMAL HIGH (ref 6–23)
CALCIUM: 9.8 mg/dL (ref 8.4–10.5)
CO2: 23 mEq/L (ref 19–32)
CREATININE: 0.64 mg/dL (ref 0.50–1.35)
Chloride: 102 mEq/L (ref 96–112)
GLUCOSE: 200 mg/dL — AB (ref 70–99)
PHOSPHORUS: 2.7 mg/dL (ref 2.3–4.6)
Potassium: 4.5 mEq/L (ref 3.5–5.3)
Sodium: 135 mEq/L (ref 135–145)

## 2013-11-04 LAB — HEMOGLOBIN A1C
Hgb A1c MFr Bld: 7.7 % — ABNORMAL HIGH (ref <5.7)
Mean Plasma Glucose: 174 mg/dL — ABNORMAL HIGH (ref <117)

## 2013-11-06 ENCOUNTER — Encounter: Payer: Self-pay | Admitting: Family Medicine

## 2013-11-06 ENCOUNTER — Ambulatory Visit (INDEPENDENT_AMBULATORY_CARE_PROVIDER_SITE_OTHER): Payer: 59 | Admitting: Family Medicine

## 2013-11-06 VITALS — BP 110/62 | HR 82 | Temp 97.9°F | Ht 68.0 in | Wt 221.1 lb

## 2013-11-06 DIAGNOSIS — Z87891 Personal history of nicotine dependence: Secondary | ICD-10-CM

## 2013-11-06 DIAGNOSIS — I10 Essential (primary) hypertension: Secondary | ICD-10-CM

## 2013-11-06 DIAGNOSIS — E669 Obesity, unspecified: Secondary | ICD-10-CM

## 2013-11-06 DIAGNOSIS — M25511 Pain in right shoulder: Secondary | ICD-10-CM

## 2013-11-06 DIAGNOSIS — E119 Type 2 diabetes mellitus without complications: Secondary | ICD-10-CM

## 2013-11-06 DIAGNOSIS — E785 Hyperlipidemia, unspecified: Secondary | ICD-10-CM

## 2013-11-06 DIAGNOSIS — M25519 Pain in unspecified shoulder: Secondary | ICD-10-CM

## 2013-11-06 MED ORDER — METFORMIN HCL 1000 MG PO TABS: ORAL_TABLET | ORAL | Status: DC

## 2013-11-06 NOTE — Assessment & Plan Note (Signed)
Well controlled, no changes to meds. Encouraged heart healthy diet such as the DASH diet and exercise as tolerated.  °

## 2013-11-06 NOTE — Assessment & Plan Note (Signed)
Encouraged DASH diet, decrease po intake and increase exercise as tolerated. Needs 7-8 hours of sleep nightly. Avoid trans fats, eat small, frequent meals every 4-5 hours with lean proteins, complex carbs and healthy fats. Minimize simple carbs, GMO foods. 

## 2013-11-06 NOTE — Progress Notes (Signed)
Patient ID: Adah SalvageJim M Banko, male   DOB: 01-26-1946, 68 y.o.   MRN: [id number] Adah SalvageJim M Heidenreich [id number] 01-26-1946 11/06/2013      Progress Note-Follow Up  Subjective  Chief Complaint  Chief Complaint  Patient presents with  . Follow-up    3 month    HPI  Patient is a 68 year old male in today for routine medical care. Here today with his wife. Doing fairly well. Shoulder improving. Admits eating 2-3 apples a day and increased carbs. Frustrated with weight gain. No smoking. Denies CP/palp/SOB/HA/congestion/fevers/GI or GU c/o. Taking meds as prescribed  Past Medical History  Diagnosis Date  . Measles as a child  . Mumps as a child  . Chicken pox as a child  . 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 Hyperlipidemia   . Poor circulation     in legs  . Hypertension   . Asthma   . COPD (chronic obstructive pulmonary disease) 68 yrs old  . Tobacco use 11/21/2012  . PTSD (post-traumatic stress disorder)   . BPH (benign prostatic hyperplasia) 11/21/2012  . Unspecified vitamin D deficiency 11/21/2012  . Vitamin B12 deficiency 11/21/2012  . Skin lesion of left arm 04/27/2013  . Back pain 05/21/2013  . Obesity, unspecified 05/26/2013  . Right shoulder pain 07/30/2013  . H/O tobacco use, presenting hazards to health 11/21/2012    1ppd since age 68     Past Surgical History  Procedure Laterality Date  . Appendectomy    . Foot surgery  40 yrs ago    left foot  . Rotator cuff repair  68 yrs old    left shoulder  . Nasal septum surgery  68 yrs old  . Vasectomy  89    Family History  Problem Relation Age of Onset  . Diabetes Mother     type 2  . Hypertension Mother   . Hyperlipidemia Mother   . Stroke Mother   . Diabetes Father     type 2  . Ulcers Father   . Hypertension Father   . Hyperlipidemia Father   . Heart attack Father     X 5  . Cancer Father     around aorta  . Heart disease Sister   . Heart disease Brother   . Diabetes Brother     type 2   . Diabetes Son     type 2  . Diabetes Sister     type 2- diabetic coma  . Diabetes Sister     ?  Marland Kitchen. Dementia Sister   . Cancer Brother 50    colon  . Diabetes Brother     type 2  . Leukemia Brother   . Dementia Brother     History   Social History  . Marital Status: Married    Spouse Name: N/A    Number of Children: N/A  . Years of Education: N/A   Occupational History  . Not on file.   Social History Main Topics  . Smoking status: Former Smoker -- 1.00 packs/day for 55 years    Types: Cigarettes    Quit date: 05/28/2013  . Smokeless tobacco: Never Used  . Alcohol Use: Yes     Comment: socially- hardly ever  . Drug Use: No  . Sexual Activity: Not on file   Other Topics Concern  . Not on file   Social History Narrative  . No narrative on file    Current  Outpatient Prescriptions on File Prior to Visit  Medication Sig Dispense Refill  . aspirin 81 MG tablet Take 81 mg by mouth daily.      . Cholecalciferol (VITAMIN D-3) 1000 UNITS CAPS Take 1 capsule by mouth daily.      . cyanocobalamin 500 MCG tablet Take 500 mcg by mouth daily.      Marland Kitchen. gemfibrozil (LOPID) 600 MG tablet Take 600 mg by mouth 2 (two) times daily before a meal.      . glipiZIDE (GLUCOTROL) 10 MG tablet Take 10 mg by mouth 2 (two) times daily before a meal.      . ibuprofen (ADVIL,MOTRIN) 200 MG tablet Take 200 mg by mouth every 6 (six) hours as needed for pain.      Marland Kitchen. lisinopril (PRINIVIL,ZESTRIL) 20 MG tablet Take 20 mg by mouth daily.      . meloxicam (MOBIC) 15 MG tablet Take 1 tablet (15 mg total) by mouth daily. With food.  30 tablet  1  . methocarbamol (ROBAXIN) 500 MG tablet Take 1 tablet (500 mg total) by mouth at bedtime as needed.  30 tablet  1  . Multiple Vitamin (MULTIVITAMIN) tablet Take 1 tablet by mouth daily.      . Omega-3 Fatty Acids (FISH OIL) 1000 MG CAPS Take 1 capsule by mouth 2 (two) times daily.      . sertraline (ZOLOFT) 100 MG tablet Take 0.5 tablets (50 mg total) by mouth  daily.      . tamsulosin (FLOMAX) 0.4 MG CAPS Take 0.4 mg by mouth daily.      . traZODone (DESYREL) 100 MG tablet Take 100 mg by mouth at bedtime.       No current facility-administered medications on file prior to visit.    No Known Allergies  Review of Systems  Review of Systems  Constitutional: Negative for fever and malaise/fatigue.  HENT: Negative for congestion.   Eyes: Negative for discharge.  Respiratory: Negative for shortness of breath.   Cardiovascular: Negative for chest pain, palpitations and leg swelling.  Gastrointestinal: Negative for nausea, abdominal pain and diarrhea.  Genitourinary: Negative for dysuria.  Musculoskeletal: Positive for joint pain. Negative for falls.       Right shoulder improving  Skin: Negative for rash.  Neurological: Negative for loss of consciousness and headaches.  Endo/Heme/Allergies: Negative for polydipsia.  Psychiatric/Behavioral: Negative for depression and suicidal ideas. The patient is not nervous/anxious and does not have insomnia.     Objective  BP 110/62  Pulse 82  Temp(Src) 97.9 F (36.6 C) (Oral)  Ht 5\' 8"  (1.727 m)  Wt 221 lb 1.3 oz (100.281 kg)  BMI 33.62 kg/m2  SpO2 96%  Physical Exam  Physical Exam  Constitutional: He is oriented to person, place, and time and well-developed, well-nourished, and in no distress. No distress.  HENT:  Head: Normocephalic and atraumatic.  Eyes: Conjunctivae are normal.  Neck: Neck supple. No thyromegaly present.  Cardiovascular: Normal rate, regular rhythm and normal heart sounds.   Pulmonary/Chest: Effort normal and breath sounds normal. No respiratory distress.  Abdominal: He exhibits no distension and no mass. There is no tenderness.  Musculoskeletal: He exhibits no edema.  Neurological: He is alert and oriented to person, place, and time.  Skin: Skin is warm.  Psychiatric: Memory, affect and judgment normal.    Lab Results  Component Value Date   TSH 1.900 11/04/2013    Lab Results  Component Value Date   WBC 4.0 11/04/2013   HGB  13.9 11/04/2013   HCT 40.3 11/04/2013   MCV 88.4 11/04/2013   PLT 186 11/04/2013   Lab Results  Component Value Date   CREATININE 0.64 11/04/2013   BUN 25* 11/04/2013   NA 135 11/04/2013   K 4.5 11/04/2013   CL 102 11/04/2013   CO2 23 11/04/2013   Lab Results  Component Value Date   ALT 20 11/04/2013   AST 18 11/04/2013   ALKPHOS 84 11/04/2013   BILITOT 0.5 11/04/2013   Lab Results  Component Value Date   CHOL 181 11/04/2013   Lab Results  Component Value Date   HDL 45 11/04/2013   Lab Results  Component Value Date   LDLCALC 108* 11/04/2013   Lab Results  Component Value Date   TRIG 140 11/04/2013   Lab Results  Component Value Date   CHOLHDL 4.0 11/04/2013     Assessment & Plan  H/O tobacco use, presenting hazards to health Has quit now and acknowledges breathing is better  Hypertension Well controlled, no changes to meds. Encouraged heart healthy diet such as the DASH diet and exercise as tolerated.   Hyperlipidemia Encouraged heart healthy diet, increase exercise, avoid trans fats, consider a krill oil cap daily. Continue Gemfibrozil and consider a statin with next blood draw if no improvement.   Diabetes mellitus without complication Worsening A1C. Will increase Metformin to max of 2500 mg and recheck in 3 months. minimize simple carbs. Increase exercise as tolerated.  Right shoulder pain Improving following with sports med  Obesity, unspecified Encouraged DASH diet, decrease po intake and increase exercise as tolerated. Needs 7-8 hours of sleep nightly. Avoid trans fats, eat small, frequent meals every 4-5 hours with lean proteins, complex carbs and healthy fats. Minimize simple carbs, GMO foods.

## 2013-11-06 NOTE — Assessment & Plan Note (Signed)
Has quit now and acknowledges breathing is better

## 2013-11-06 NOTE — Patient Instructions (Signed)

## 2013-11-06 NOTE — Assessment & Plan Note (Signed)
Worsening A1C. Will increase Metformin to max of 2500 mg and recheck in 3 months. minimize simple carbs. Increase exercise as tolerated.

## 2013-11-06 NOTE — Assessment & Plan Note (Signed)
Encouraged heart healthy diet, increase exercise, avoid trans fats, consider a krill oil cap daily. Continue Gemfibrozil and consider a statin with next blood draw if no improvement.

## 2013-11-06 NOTE — Progress Notes (Signed)
Pre visit review using our clinic review tool, if applicable. No additional management support is needed unless otherwise documented below in the visit note. 

## 2013-11-06 NOTE — Assessment & Plan Note (Signed)
Improving following with sports med

## 2013-11-07 ENCOUNTER — Telehealth: Payer: Self-pay | Admitting: Family Medicine

## 2013-11-07 NOTE — Telephone Encounter (Signed)
Relevant patient education assigned to patient using Emmi. ° °

## 2013-11-14 ENCOUNTER — Telehealth: Payer: Self-pay

## 2013-11-14 NOTE — Telephone Encounter (Signed)
Relevant patient education assigned to patient using Emmi. ° °

## 2013-12-01 ENCOUNTER — Encounter: Payer: Self-pay | Admitting: Family Medicine

## 2013-12-01 ENCOUNTER — Ambulatory Visit (HOSPITAL_BASED_OUTPATIENT_CLINIC_OR_DEPARTMENT_OTHER)
Admission: RE | Admit: 2013-12-01 | Discharge: 2013-12-01 | Disposition: A | Discharge status: Home / Self Care | Payer: 59 | Source: Ambulatory Visit | Attending: Family Medicine | Admitting: Family Medicine

## 2013-12-01 ENCOUNTER — Ambulatory Visit (INDEPENDENT_AMBULATORY_CARE_PROVIDER_SITE_OTHER): Payer: 59 | Admitting: Family Medicine

## 2013-12-01 VITALS — BP 118/70 | HR 78 | Ht 68.0 in | Wt 222.0 lb

## 2013-12-01 DIAGNOSIS — M25511 Pain in right shoulder: Secondary | ICD-10-CM

## 2013-12-01 DIAGNOSIS — M25519 Pain in unspecified shoulder: Secondary | ICD-10-CM

## 2013-12-01 DIAGNOSIS — M19019 Primary osteoarthritis, unspecified shoulder: Secondary | ICD-10-CM | POA: Insufficient documentation

## 2013-12-02 ENCOUNTER — Encounter: Payer: Self-pay | Admitting: Family Medicine

## 2013-12-02 ENCOUNTER — Ambulatory Visit (HOSPITAL_BASED_OUTPATIENT_CLINIC_OR_DEPARTMENT_OTHER)
Admission: RE | Admit: 2013-12-02 | Discharge: 2013-12-02 | Disposition: A | Discharge status: Home / Self Care | Payer: Medicare Other | Source: Ambulatory Visit | Attending: Family Medicine | Admitting: Family Medicine

## 2013-12-02 DIAGNOSIS — M25511 Pain in right shoulder: Secondary | ICD-10-CM

## 2013-12-02 DIAGNOSIS — M25519 Pain in unspecified shoulder: Secondary | ICD-10-CM | POA: Insufficient documentation

## 2013-12-02 DIAGNOSIS — X58XXXA Exposure to other specified factors, initial encounter: Secondary | ICD-10-CM | POA: Insufficient documentation

## 2013-12-02 DIAGNOSIS — S46919A Strain of unspecified muscle, fascia and tendon at shoulder and upper arm level, unspecified arm, initial encounter: Secondary | ICD-10-CM | POA: Insufficient documentation

## 2013-12-02 NOTE — Progress Notes (Addendum)
Patient ID: Peter Adams, male   DOB: [birth year], 68 y.o.   MRN: [id number]  PCP: Danise EdgeBLYTH, STACEY, MD  Subjective:   HPI: Patient is a 68 y.o. male here for right shoulder pain.  1/28: Patient reports for about 4 months she's had right sided shoulder pain radiating down to elbow. Limited range of motion. No obvious injury. Pain worse lying on this shoulder. Is right handed. Not taking any medications. Not waking him up at night.  2/11: Patient returns as shoulder pain is not improving, would like to pursue injections. Doing codman exercises. Taking meloxicam.  3/25: Patient reports he has improved since last visit. Injection and mobic have helped. Doing PT with a personal trainer 3 times a week and doing HEP also. Motion and pain improved. Some night pain still.  5/18: Patient reports he does not feel better - has worsened since last visit - pain 7/10 and up to 10/10 at night. Still very limited motion despite HEP. Doing exercises in the pool. No swelling. Pain worse when startled and all motions of shoulder.  Past Medical History  Diagnosis Date  . Measles as a child  . Mumps as a child  . Chicken pox as a child  . 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 Hyperlipidemia   . Poor circulation     in legs  . Hypertension   . Asthma   . COPD (chronic obstructive pulmonary disease) 68 yrs old  . Tobacco use 11/21/2012  . PTSD (post-traumatic stress disorder)   . BPH (benign prostatic hyperplasia) 11/21/2012  . Unspecified vitamin D deficiency 11/21/2012  . Vitamin B12 deficiency 11/21/2012  . Skin lesion of left arm 04/27/2013  . Back pain 05/21/2013  . Obesity, unspecified 05/26/2013  . Right shoulder pain 07/30/2013  . H/O tobacco use, presenting hazards to health 11/21/2012    1ppd since age 68     Current Outpatient Prescriptions on File Prior to Visit  Medication Sig Dispense Refill  . aspirin 81 MG tablet Take 81 mg by mouth daily.      .  Cholecalciferol (VITAMIN D-3) 1000 UNITS CAPS Take 1 capsule by mouth daily.      . cyanocobalamin 500 MCG tablet Take 500 mcg by mouth daily.      Marland Kitchen. gemfibrozil (LOPID) 600 MG tablet Take 600 mg by mouth 2 (two) times daily before a meal.      . glipiZIDE (GLUCOTROL) 10 MG tablet Take 10 mg by mouth 2 (two) times daily before a meal.      . ibuprofen (ADVIL,MOTRIN) 200 MG tablet Take 200 mg by mouth every 6 (six) hours as needed for pain.      Marland Kitchen. lisinopril (PRINIVIL,ZESTRIL) 20 MG tablet Take 20 mg by mouth daily.      . meloxicam (MOBIC) 15 MG tablet Take 1 tablet (15 mg total) by mouth daily. With food.  30 tablet  1  . metFORMIN (GLUCOPHAGE) 1000 MG tablet 1 tab po bid and 1/2 tab at noon  225 tablet  1  . methocarbamol (ROBAXIN) 500 MG tablet Take 1 tablet (500 mg total) by mouth at bedtime as needed.  30 tablet  1  . Multiple Vitamin (MULTIVITAMIN) tablet Take 1 tablet by mouth daily.      . Omega-3 Fatty Acids (FISH OIL) 1000 MG CAPS Take 1 capsule by mouth 2 (two) times daily.      . sertraline (ZOLOFT) 100 MG tablet Take 0.5  tablets (50 mg total) by mouth daily.      . tamsulosin (FLOMAX) 0.4 MG CAPS Take 0.4 mg by mouth daily.      . traZODone (DESYREL) 100 MG tablet Take 100 mg by mouth at bedtime.       No current facility-administered medications on file prior to visit.    Past Surgical History  Procedure Laterality Date  . Appendectomy    . Foot surgery  40 yrs ago    left foot  . Rotator cuff repair  68 yrs old    left shoulder  . Nasal septum surgery  68 yrs old  . Vasectomy  42    No Known Allergies  History   Social History  . Marital Status: Married    Spouse Name: N/A    Number of Children: N/A  . Years of Education: N/A   Occupational History  . Not on file.   Social History Main Topics  . Smoking status: Former Smoker -- 1.00 packs/day for 55 years    Types: Cigarettes    Quit date: 05/28/2013  . Smokeless tobacco: Never Used  . Alcohol Use: Yes      Comment: socially- hardly ever  . Drug Use: No  . Sexual Activity: Not on file   Other Topics Concern  . Not on file   Social History Narrative  . No narrative on file    Family History  Problem Relation Age of Onset  . Diabetes Mother     type 2  . Hypertension Mother   . Hyperlipidemia Mother   . Stroke Mother   . Diabetes Father     type 2  . Ulcers Father   . Hypertension Father   . Hyperlipidemia Father   . Heart attack Father     X 5  . Cancer Father     around aorta  . Heart disease Sister   . Heart disease Brother   . Diabetes Brother     type 2  . Diabetes Son     type 2  . Diabetes Sister     type 2- diabetic coma  . Diabetes Sister     ?  Marland Kitchen. Dementia Sister   . Cancer Brother 50    colon  . Diabetes Brother     type 2  . Leukemia Brother   . Dementia Brother     BP 118/70  Pulse 78  Ht 5\' 8"  (1.727 m)  Wt 222 lb (100.699 kg)  BMI 33.76 kg/m2  Review of Systems: See HPI above.    Objective:  Physical Exam:  Gen: NAD  Right shoulder: No swelling, ecchymoses.  No gross deformity. No TTP AC joint, biceps tendon. ER now limited to 40 degrees.  Abduction/flexion to 90 degrees, all motions painful. Positive Hawkins, Neers. Negative Yergasons. Strength 5/5 with empty can and resisted internal/external rotation - pain empty can. Negative apprehension. NV intact distally.    Assessment & Plan:  1. Right shoulder pain - 2/2 adhesive capsulitis, rotator cuff tendinitis.  Unfortunately pain is worsening instead of improving.  Will move forward with MRI to further assess possibility of underlying rotator cuff tear in addition to adhesive capsulitis.    Addendum:  MRI reviewed and discussed with patient.  He does have a small full thickness tear of infraspinatus.  Certainly adhesive capsulitis accounts for majority of his pain and limited motion however.  Will refer to orthopedics to consider manipulation under anesthesia, possible arthroscopy.

## 2013-12-02 NOTE — Assessment & Plan Note (Signed)
2/2 adhesive capsulitis, rotator cuff tendinitis.  Unfortunately pain is worsening instead of improving.  Will move forward with MRI to further assess possibility of underlying rotator cuff tear in addition to adhesive capsulitis.

## 2013-12-03 ENCOUNTER — Other Ambulatory Visit: Payer: Self-pay | Admitting: *Deleted

## 2013-12-03 DIAGNOSIS — M25511 Pain in right shoulder: Secondary | ICD-10-CM

## 2013-12-23 ENCOUNTER — Ambulatory Visit: Payer: Medicare Other | Attending: Orthopaedic Surgery | Admitting: Physical Therapy

## 2013-12-23 DIAGNOSIS — M25519 Pain in unspecified shoulder: Secondary | ICD-10-CM | POA: Diagnosis not present

## 2013-12-23 DIAGNOSIS — E119 Type 2 diabetes mellitus without complications: Secondary | ICD-10-CM | POA: Diagnosis not present

## 2013-12-23 DIAGNOSIS — R609 Edema, unspecified: Secondary | ICD-10-CM | POA: Diagnosis not present

## 2013-12-23 DIAGNOSIS — Z9889 Other specified postprocedural states: Secondary | ICD-10-CM | POA: Diagnosis not present

## 2013-12-23 DIAGNOSIS — M25619 Stiffness of unspecified shoulder, not elsewhere classified: Secondary | ICD-10-CM | POA: Diagnosis not present

## 2013-12-23 DIAGNOSIS — IMO0001 Reserved for inherently not codable concepts without codable children: Secondary | ICD-10-CM | POA: Diagnosis present

## 2013-12-25 ENCOUNTER — Ambulatory Visit: Payer: Medicare Other | Admitting: Rehabilitation

## 2013-12-25 DIAGNOSIS — IMO0001 Reserved for inherently not codable concepts without codable children: Secondary | ICD-10-CM | POA: Diagnosis not present

## 2013-12-30 ENCOUNTER — Ambulatory Visit: Payer: Medicare Other | Admitting: Physical Therapy

## 2013-12-30 DIAGNOSIS — IMO0001 Reserved for inherently not codable concepts without codable children: Secondary | ICD-10-CM | POA: Diagnosis not present

## 2014-01-02 ENCOUNTER — Ambulatory Visit: Payer: Medicare Other | Admitting: Physical Therapy

## 2014-01-06 ENCOUNTER — Ambulatory Visit: Payer: Medicare Other

## 2014-01-06 DIAGNOSIS — IMO0001 Reserved for inherently not codable concepts without codable children: Secondary | ICD-10-CM | POA: Diagnosis not present

## 2014-01-07 ENCOUNTER — Ambulatory Visit: Payer: Medicare Other | Admitting: Rehabilitation

## 2014-01-07 DIAGNOSIS — IMO0001 Reserved for inherently not codable concepts without codable children: Secondary | ICD-10-CM | POA: Diagnosis not present

## 2014-01-12 ENCOUNTER — Ambulatory Visit: Payer: Medicare Other | Admitting: Rehabilitation

## 2014-01-12 DIAGNOSIS — IMO0001 Reserved for inherently not codable concepts without codable children: Secondary | ICD-10-CM | POA: Diagnosis not present

## 2014-01-14 ENCOUNTER — Ambulatory Visit: Payer: Medicare Other | Attending: Orthopaedic Surgery | Admitting: Rehabilitation

## 2014-01-14 DIAGNOSIS — E119 Type 2 diabetes mellitus without complications: Secondary | ICD-10-CM | POA: Diagnosis not present

## 2014-01-14 DIAGNOSIS — IMO0001 Reserved for inherently not codable concepts without codable children: Secondary | ICD-10-CM | POA: Diagnosis not present

## 2014-01-14 DIAGNOSIS — R609 Edema, unspecified: Secondary | ICD-10-CM | POA: Diagnosis not present

## 2014-01-14 DIAGNOSIS — M25519 Pain in unspecified shoulder: Secondary | ICD-10-CM | POA: Insufficient documentation

## 2014-01-14 DIAGNOSIS — M25619 Stiffness of unspecified shoulder, not elsewhere classified: Secondary | ICD-10-CM | POA: Insufficient documentation

## 2014-01-14 DIAGNOSIS — Z9889 Other specified postprocedural states: Secondary | ICD-10-CM | POA: Insufficient documentation

## 2014-01-19 ENCOUNTER — Ambulatory Visit: Payer: Medicare Other | Admitting: Rehabilitation

## 2014-01-19 DIAGNOSIS — IMO0001 Reserved for inherently not codable concepts without codable children: Secondary | ICD-10-CM | POA: Diagnosis not present

## 2014-01-21 ENCOUNTER — Ambulatory Visit: Payer: Medicare Other | Admitting: Rehabilitation

## 2014-01-21 DIAGNOSIS — IMO0001 Reserved for inherently not codable concepts without codable children: Secondary | ICD-10-CM | POA: Diagnosis not present

## 2014-01-23 ENCOUNTER — Ambulatory Visit: Payer: Medicare Other | Admitting: Rehabilitation

## 2014-01-23 DIAGNOSIS — IMO0001 Reserved for inherently not codable concepts without codable children: Secondary | ICD-10-CM | POA: Diagnosis not present

## 2014-01-26 ENCOUNTER — Ambulatory Visit: Payer: Medicare Other | Admitting: Rehabilitation

## 2014-01-26 DIAGNOSIS — IMO0001 Reserved for inherently not codable concepts without codable children: Secondary | ICD-10-CM | POA: Diagnosis not present

## 2014-01-28 ENCOUNTER — Ambulatory Visit: Payer: Medicare Other | Admitting: Physical Therapy

## 2014-01-28 ENCOUNTER — Telehealth: Payer: Self-pay

## 2014-01-28 DIAGNOSIS — E785 Hyperlipidemia, unspecified: Secondary | ICD-10-CM

## 2014-01-28 DIAGNOSIS — IMO0001 Reserved for inherently not codable concepts without codable children: Secondary | ICD-10-CM | POA: Diagnosis not present

## 2014-01-28 NOTE — Telephone Encounter (Signed)
Diabetic bundle:  I called pt and his spouse answered (doesn't say we can speak to her). I informed spouse that I would send a mychart message and have pt check that later today. pts spouse voiced understanding.  Labs ordered for ldl

## 2014-01-30 ENCOUNTER — Ambulatory Visit: Payer: Medicare Other | Admitting: Rehabilitation

## 2014-01-30 DIAGNOSIS — IMO0001 Reserved for inherently not codable concepts without codable children: Secondary | ICD-10-CM | POA: Diagnosis not present

## 2014-02-02 ENCOUNTER — Ambulatory Visit: Payer: Medicare Other | Admitting: Physical Therapy

## 2014-02-04 ENCOUNTER — Ambulatory Visit: Payer: Medicare Other | Admitting: Rehabilitation

## 2014-02-04 DIAGNOSIS — IMO0001 Reserved for inherently not codable concepts without codable children: Secondary | ICD-10-CM | POA: Diagnosis not present

## 2014-02-06 ENCOUNTER — Ambulatory Visit: Payer: Medicare Other | Admitting: Rehabilitation

## 2014-02-09 ENCOUNTER — Ambulatory Visit: Payer: Medicare Other | Admitting: Physical Therapy

## 2014-02-09 DIAGNOSIS — IMO0001 Reserved for inherently not codable concepts without codable children: Secondary | ICD-10-CM | POA: Diagnosis not present

## 2014-02-11 ENCOUNTER — Ambulatory Visit: Payer: Medicare Other | Admitting: Rehabilitation

## 2014-02-11 DIAGNOSIS — IMO0001 Reserved for inherently not codable concepts without codable children: Secondary | ICD-10-CM | POA: Diagnosis not present

## 2014-02-13 ENCOUNTER — Ambulatory Visit: Payer: Medicare Other | Admitting: Rehabilitation

## 2014-02-13 DIAGNOSIS — IMO0001 Reserved for inherently not codable concepts without codable children: Secondary | ICD-10-CM | POA: Diagnosis not present

## 2014-02-16 ENCOUNTER — Ambulatory Visit: Payer: 59 | Admitting: Family Medicine

## 2014-02-16 ENCOUNTER — Ambulatory Visit: Payer: Medicare Other | Attending: Orthopaedic Surgery | Admitting: Rehabilitation

## 2014-02-16 DIAGNOSIS — E119 Type 2 diabetes mellitus without complications: Secondary | ICD-10-CM | POA: Diagnosis not present

## 2014-02-16 DIAGNOSIS — Z9889 Other specified postprocedural states: Secondary | ICD-10-CM | POA: Insufficient documentation

## 2014-02-16 DIAGNOSIS — M25619 Stiffness of unspecified shoulder, not elsewhere classified: Secondary | ICD-10-CM | POA: Insufficient documentation

## 2014-02-16 DIAGNOSIS — R609 Edema, unspecified: Secondary | ICD-10-CM | POA: Diagnosis not present

## 2014-02-16 DIAGNOSIS — IMO0001 Reserved for inherently not codable concepts without codable children: Secondary | ICD-10-CM | POA: Diagnosis present

## 2014-02-16 DIAGNOSIS — M25519 Pain in unspecified shoulder: Secondary | ICD-10-CM | POA: Insufficient documentation

## 2014-02-18 ENCOUNTER — Ambulatory Visit: Payer: Medicare Other | Admitting: Physical Therapy

## 2014-02-18 DIAGNOSIS — IMO0001 Reserved for inherently not codable concepts without codable children: Secondary | ICD-10-CM | POA: Diagnosis not present

## 2014-02-20 ENCOUNTER — Ambulatory Visit: Payer: Medicare Other | Admitting: Rehabilitation

## 2014-02-20 DIAGNOSIS — IMO0001 Reserved for inherently not codable concepts without codable children: Secondary | ICD-10-CM | POA: Diagnosis not present

## 2014-02-23 ENCOUNTER — Ambulatory Visit: Payer: Medicare Other | Admitting: Rehabilitation

## 2014-02-23 DIAGNOSIS — IMO0001 Reserved for inherently not codable concepts without codable children: Secondary | ICD-10-CM | POA: Diagnosis not present

## 2014-02-25 ENCOUNTER — Ambulatory Visit: Payer: Medicare Other | Admitting: Physical Therapy

## 2014-02-25 DIAGNOSIS — IMO0001 Reserved for inherently not codable concepts without codable children: Secondary | ICD-10-CM | POA: Diagnosis not present

## 2014-02-27 ENCOUNTER — Ambulatory Visit: Payer: Medicare Other | Admitting: Physical Therapy

## 2014-02-27 DIAGNOSIS — IMO0001 Reserved for inherently not codable concepts without codable children: Secondary | ICD-10-CM | POA: Diagnosis not present

## 2014-03-02 ENCOUNTER — Ambulatory Visit: Payer: Medicare Other | Admitting: Rehabilitation

## 2014-03-02 DIAGNOSIS — IMO0001 Reserved for inherently not codable concepts without codable children: Secondary | ICD-10-CM | POA: Diagnosis not present

## 2014-03-04 ENCOUNTER — Ambulatory Visit: Payer: Medicare Other | Admitting: Physical Therapy

## 2014-03-04 DIAGNOSIS — IMO0001 Reserved for inherently not codable concepts without codable children: Secondary | ICD-10-CM | POA: Diagnosis not present

## 2014-03-05 ENCOUNTER — Ambulatory Visit: Payer: 59 | Admitting: Family Medicine

## 2014-03-06 ENCOUNTER — Ambulatory Visit: Payer: Medicare Other | Admitting: Physical Therapy

## 2014-03-06 DIAGNOSIS — IMO0001 Reserved for inherently not codable concepts without codable children: Secondary | ICD-10-CM | POA: Diagnosis not present

## 2014-03-11 ENCOUNTER — Ambulatory Visit: Payer: Medicare Other | Admitting: Rehabilitation

## 2014-03-11 DIAGNOSIS — IMO0001 Reserved for inherently not codable concepts without codable children: Secondary | ICD-10-CM | POA: Diagnosis not present

## 2014-03-13 ENCOUNTER — Ambulatory Visit: Payer: Medicare Other | Admitting: Rehabilitation

## 2014-03-13 DIAGNOSIS — IMO0001 Reserved for inherently not codable concepts without codable children: Secondary | ICD-10-CM | POA: Diagnosis not present

## 2014-04-17 ENCOUNTER — Ambulatory Visit: Payer: Medicare Other | Attending: Orthopaedic Surgery | Admitting: Physical Therapy

## 2014-04-17 DIAGNOSIS — M25511 Pain in right shoulder: Secondary | ICD-10-CM | POA: Diagnosis not present

## 2014-04-17 DIAGNOSIS — R609 Edema, unspecified: Secondary | ICD-10-CM | POA: Insufficient documentation

## 2014-04-17 DIAGNOSIS — M25611 Stiffness of right shoulder, not elsewhere classified: Secondary | ICD-10-CM | POA: Diagnosis present

## 2014-04-17 DIAGNOSIS — E119 Type 2 diabetes mellitus without complications: Secondary | ICD-10-CM | POA: Diagnosis not present

## 2014-04-17 DIAGNOSIS — Z9889 Other specified postprocedural states: Secondary | ICD-10-CM | POA: Diagnosis not present

## 2014-04-20 ENCOUNTER — Ambulatory Visit: Payer: Medicare Other | Admitting: Rehabilitation

## 2014-04-20 DIAGNOSIS — M25511 Pain in right shoulder: Secondary | ICD-10-CM | POA: Diagnosis not present

## 2014-04-22 ENCOUNTER — Ambulatory Visit: Payer: Medicare Other | Admitting: Physical Therapy

## 2014-04-22 DIAGNOSIS — M25511 Pain in right shoulder: Secondary | ICD-10-CM | POA: Diagnosis not present

## 2014-04-23 ENCOUNTER — Ambulatory Visit: Payer: Medicare Other | Admitting: Physical Therapy

## 2014-04-24 ENCOUNTER — Ambulatory Visit: Payer: Medicare Other

## 2014-04-24 DIAGNOSIS — M25511 Pain in right shoulder: Secondary | ICD-10-CM | POA: Diagnosis not present

## 2014-04-27 ENCOUNTER — Ambulatory Visit: Payer: Medicare Other | Admitting: Rehabilitation

## 2014-04-27 DIAGNOSIS — M25511 Pain in right shoulder: Secondary | ICD-10-CM | POA: Diagnosis not present

## 2014-04-28 ENCOUNTER — Ambulatory Visit: Payer: Medicare Other | Admitting: Rehabilitation

## 2014-04-28 DIAGNOSIS — M25511 Pain in right shoulder: Secondary | ICD-10-CM | POA: Diagnosis not present

## 2014-04-30 ENCOUNTER — Ambulatory Visit: Payer: Medicare Other | Admitting: Rehabilitation

## 2014-04-30 DIAGNOSIS — M25511 Pain in right shoulder: Secondary | ICD-10-CM | POA: Diagnosis not present

## 2014-05-04 ENCOUNTER — Ambulatory Visit: Payer: Medicare Other | Admitting: Rehabilitation

## 2014-05-06 ENCOUNTER — Ambulatory Visit: Payer: Medicare Other | Admitting: Physical Therapy

## 2014-05-08 ENCOUNTER — Ambulatory Visit: Payer: Medicare Other | Admitting: Physical Therapy

## 2014-05-08 DIAGNOSIS — M25511 Pain in right shoulder: Secondary | ICD-10-CM | POA: Diagnosis not present

## 2014-05-11 ENCOUNTER — Ambulatory Visit: Payer: Medicare Other | Admitting: Rehabilitation

## 2014-05-11 DIAGNOSIS — M25511 Pain in right shoulder: Secondary | ICD-10-CM | POA: Diagnosis not present

## 2014-05-13 ENCOUNTER — Ambulatory Visit: Payer: Medicare Other | Admitting: Physical Therapy

## 2014-05-15 ENCOUNTER — Ambulatory Visit: Payer: Medicare Other | Admitting: Rehabilitation

## 2014-05-15 DIAGNOSIS — M25511 Pain in right shoulder: Secondary | ICD-10-CM | POA: Diagnosis not present

## 2014-05-18 ENCOUNTER — Ambulatory Visit: Payer: Medicare Other | Attending: Orthopaedic Surgery | Admitting: Rehabilitation

## 2014-05-18 DIAGNOSIS — Z9889 Other specified postprocedural states: Secondary | ICD-10-CM | POA: Insufficient documentation

## 2014-05-18 DIAGNOSIS — M25511 Pain in right shoulder: Secondary | ICD-10-CM | POA: Insufficient documentation

## 2014-05-18 DIAGNOSIS — M25611 Stiffness of right shoulder, not elsewhere classified: Secondary | ICD-10-CM | POA: Diagnosis present

## 2014-05-18 DIAGNOSIS — E119 Type 2 diabetes mellitus without complications: Secondary | ICD-10-CM | POA: Insufficient documentation

## 2014-05-18 DIAGNOSIS — R609 Edema, unspecified: Secondary | ICD-10-CM | POA: Diagnosis not present

## 2014-05-21 ENCOUNTER — Ambulatory Visit: Payer: Medicare Other | Admitting: Physical Therapy

## 2014-05-21 DIAGNOSIS — M25511 Pain in right shoulder: Secondary | ICD-10-CM | POA: Diagnosis not present

## 2014-05-25 ENCOUNTER — Ambulatory Visit: Payer: Medicare Other | Admitting: Physical Therapy

## 2014-05-25 DIAGNOSIS — M25511 Pain in right shoulder: Secondary | ICD-10-CM | POA: Diagnosis not present

## 2014-05-28 ENCOUNTER — Ambulatory Visit: Payer: Medicare Other | Admitting: Rehabilitation

## 2014-05-28 DIAGNOSIS — M25511 Pain in right shoulder: Secondary | ICD-10-CM | POA: Diagnosis not present

## 2014-06-02 ENCOUNTER — Ambulatory Visit: Payer: Medicare Other | Admitting: Rehabilitation

## 2014-06-02 DIAGNOSIS — M25511 Pain in right shoulder: Secondary | ICD-10-CM | POA: Diagnosis not present

## 2014-06-05 ENCOUNTER — Ambulatory Visit: Payer: Medicare Other | Admitting: Physical Therapy

## 2014-06-09 ENCOUNTER — Ambulatory Visit: Payer: Medicare Other | Admitting: Rehabilitation

## 2014-06-09 DIAGNOSIS — M25511 Pain in right shoulder: Secondary | ICD-10-CM | POA: Diagnosis not present

## 2014-06-10 ENCOUNTER — Ambulatory Visit: Payer: Medicare Other | Admitting: Rehabilitation

## 2014-06-10 DIAGNOSIS — M25511 Pain in right shoulder: Secondary | ICD-10-CM | POA: Diagnosis not present

## 2014-06-15 ENCOUNTER — Ambulatory Visit: Payer: Medicare Other | Admitting: Physical Therapy

## 2014-06-15 DIAGNOSIS — M25511 Pain in right shoulder: Secondary | ICD-10-CM | POA: Diagnosis not present

## 2014-06-18 ENCOUNTER — Ambulatory Visit: Payer: Medicare Other | Attending: Orthopaedic Surgery | Admitting: Rehabilitation

## 2014-06-18 DIAGNOSIS — M25611 Stiffness of right shoulder, not elsewhere classified: Secondary | ICD-10-CM | POA: Insufficient documentation

## 2014-06-18 DIAGNOSIS — Z9889 Other specified postprocedural states: Secondary | ICD-10-CM | POA: Diagnosis not present

## 2014-06-18 DIAGNOSIS — R609 Edema, unspecified: Secondary | ICD-10-CM | POA: Insufficient documentation

## 2014-06-18 DIAGNOSIS — M25511 Pain in right shoulder: Secondary | ICD-10-CM | POA: Insufficient documentation

## 2014-06-18 DIAGNOSIS — E119 Type 2 diabetes mellitus without complications: Secondary | ICD-10-CM | POA: Insufficient documentation

## 2014-06-22 ENCOUNTER — Ambulatory Visit: Payer: Medicare Other | Admitting: Physical Therapy

## 2014-06-22 DIAGNOSIS — M25511 Pain in right shoulder: Secondary | ICD-10-CM | POA: Diagnosis not present

## 2014-06-25 ENCOUNTER — Encounter: Payer: Self-pay | Admitting: Family Medicine

## 2014-06-25 ENCOUNTER — Ambulatory Visit (HOSPITAL_BASED_OUTPATIENT_CLINIC_OR_DEPARTMENT_OTHER)
Admission: RE | Admit: 2014-06-25 | Discharge: 2014-06-25 | Disposition: A | Discharge status: Home / Self Care | Payer: Medicare Other | Source: Ambulatory Visit | Attending: Family Medicine | Admitting: Family Medicine

## 2014-06-25 ENCOUNTER — Telehealth: Payer: Self-pay | Admitting: Family Medicine

## 2014-06-25 ENCOUNTER — Ambulatory Visit (INDEPENDENT_AMBULATORY_CARE_PROVIDER_SITE_OTHER): Payer: Medicare Other | Admitting: Family Medicine

## 2014-06-25 VITALS — BP 141/78 | HR 79 | Temp 97.7°F | Ht 68.0 in | Wt 221.4 lb

## 2014-06-25 DIAGNOSIS — M545 Low back pain: Secondary | ICD-10-CM | POA: Diagnosis present

## 2014-06-25 DIAGNOSIS — F4310 Post-traumatic stress disorder, unspecified: Secondary | ICD-10-CM

## 2014-06-25 DIAGNOSIS — F32A Depression, unspecified: Secondary | ICD-10-CM

## 2014-06-25 DIAGNOSIS — F329 Major depressive disorder, single episode, unspecified: Secondary | ICD-10-CM

## 2014-06-25 DIAGNOSIS — Z23 Encounter for immunization: Secondary | ICD-10-CM

## 2014-06-25 DIAGNOSIS — I10 Essential (primary) hypertension: Secondary | ICD-10-CM

## 2014-06-25 DIAGNOSIS — E669 Obesity, unspecified: Secondary | ICD-10-CM

## 2014-06-25 DIAGNOSIS — Z720 Tobacco use: Secondary | ICD-10-CM

## 2014-06-25 DIAGNOSIS — M5440 Lumbago with sciatica, unspecified side: Secondary | ICD-10-CM

## 2014-06-25 DIAGNOSIS — E119 Type 2 diabetes mellitus without complications: Secondary | ICD-10-CM

## 2014-06-25 MED ORDER — BACLOFEN 10 MG PO TABS: 10.0000 mg | ORAL_TABLET | Freq: Two times a day (BID) | ORAL | Status: DC | PRN

## 2014-06-25 NOTE — Progress Notes (Signed)
Pre visit review using our clinic review tool, if applicable. No additional management support is needed unless otherwise documented below in the visit note. 

## 2014-06-25 NOTE — Telephone Encounter (Signed)
Pt wife called to inform you the following information as per your request her husband is taking sertraline (ZOLOFT) 100 MG tablet once a day.

## 2014-06-25 NOTE — Patient Instructions (Signed)
May alterate Tylenol and Advil/Ibuprofen for pain

## 2014-06-26 ENCOUNTER — Ambulatory Visit: Payer: Medicare Other | Admitting: Physical Therapy

## 2014-06-26 MED ORDER — SERTRALINE HCL 100 MG PO TABS: 150.0000 mg | ORAL_TABLET | Freq: Every day | ORAL | Status: AC

## 2014-06-26 NOTE — Telephone Encounter (Signed)
Have him increase his Sertraline to 150 mg daily, continue 100 mg tabs and take 1 1/2 tabs daily, 30 day supply with 2 rf

## 2014-06-29 ENCOUNTER — Encounter: Payer: Self-pay | Admitting: Family Medicine

## 2014-06-29 NOTE — Assessment & Plan Note (Signed)
Well controlled, no changes to meds. Encouraged heart healthy diet such as the DASH diet and exercise as tolerated.  °

## 2014-06-29 NOTE — Telephone Encounter (Signed)
Late Entry:  06/26/14 @ 4:46 pm  Pt notified and made aware.  Pt in agreement with plan.  New prescription printed and placed in Dr. Mariel AloeBlyth's red folder for review and signature.

## 2014-06-29 NOTE — Assessment & Plan Note (Signed)
Worsening with radicular pain down both hips and legs. Referred to ortho for further consideration due to worsening pain, may continue meds prn.

## 2014-06-29 NOTE — Assessment & Plan Note (Signed)
Worsening, is having nightmares. Has begun to participate in a support group at the [facility] and he feels that is helping. Will increase the Sertraline to 150 mg daily and reassess at next visit

## 2014-06-29 NOTE — Assessment & Plan Note (Signed)
Encouraged DASH diet, decrease po intake and increase exercise as tolerated. Needs 7-8 hours of sleep nightly. Avoid trans fats, eat small, frequent meals every 4-5 hours with lean proteins, complex carbs and healthy fats. Minimize simple carbs, GMO foods. 

## 2014-06-29 NOTE — Progress Notes (Signed)
Peter Adams  [id number] [birth year] 06/29/2014      Progress Note-Follow Up  Subjective  Chief Complaint  Chief Complaint  Patient presents with  . Hip Pain    and pain going down both legs  . Injections    flu    HPI  Patient is a 68 y.o. male in today for routine medical care. Patient is in today for evaluation of multiple concerns. His low back pain continues to worsen. He's had no falls or trauma but has daily low back pain with pain bilaterally into his hips and legs. No incontinence. Does note his balance is getting worse. Did have some relief with his shoulder discomfort with physical therapy. Agrees to flu shot today. Is also struggling with increased anxiety and angry outbursts. His PTSD is worsening and is having significant nightmares. Is established very recently with a support group at the [facility] and finds that helpful. Cries and becomes angry quickly. Denies suicidal or homicidal ideation.  Past Medical History  Diagnosis Date  . Measles as a child  . Mumps as a child  . Chicken pox as a child  . 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 Hyperlipidemia   . Poor circulation     in legs  . Hypertension   . Asthma   . COPD (chronic obstructive pulmonary disease) 68 yrs old  . Tobacco use 11/21/2012  . PTSD (post-traumatic stress disorder)   . BPH (benign prostatic hyperplasia) 11/21/2012  . Unspecified vitamin D deficiency 11/21/2012  . Vitamin B12 deficiency 11/21/2012  . Skin lesion of left arm 04/27/2013  . Back pain 05/21/2013  . Obesity, unspecified 05/26/2013  . Right shoulder pain 07/30/2013  . H/O tobacco use, presenting hazards to health 11/21/2012    1ppd since age 68     Past Surgical History  Procedure Laterality Date  . Appendectomy    . Foot surgery  40 yrs ago    left foot  . Rotator cuff repair  68 yrs old    left shoulder  . Nasal septum surgery  68 yrs old  . Vasectomy  3842    Family History  Problem Relation Age of Onset   . Diabetes Mother     type 2  . Hypertension Mother   . Hyperlipidemia Mother   . Stroke Mother   . Diabetes Father     type 2  . Ulcers Father   . Hypertension Father   . Hyperlipidemia Father   . Heart attack Father     X 5  . Cancer Father     around aorta  . Heart disease Sister   . Heart disease Brother   . Diabetes Brother     type 2  . Diabetes Son     type 2  . Diabetes Sister     type 2- diabetic coma  . Diabetes Sister     ?  Marland Kitchen. Dementia Sister   . Cancer Brother 50    colon  . Diabetes Brother     type 2  . Leukemia Brother   . Dementia Brother     History   Social History  . Marital Status: Married    Spouse Name: N/A    Number of Children: N/A  . Years of Education: N/A   Occupational History  . Not on file.   Social History Main Topics  . Smoking status: Former Smoker -- 1.00 packs/day for 55 years  Types: Cigarettes    Quit date: 05/28/2013  . Smokeless tobacco: Never Used  . Alcohol Use: Yes     Comment: socially- hardly ever  . Drug Use: No  . Sexual Activity: Not on file   Other Topics Concern  . Not on file   Social History Narrative    Current Outpatient Prescriptions on File Prior to Visit  Medication Sig Dispense Refill  . aspirin 81 MG tablet Take 81 mg by mouth daily.    . Cholecalciferol (VITAMIN D-3) 1000 UNITS CAPS Take 1 capsule by mouth daily.    . cyanocobalamin 500 MCG tablet Take 500 mcg by mouth daily.    Marland Kitchen. gemfibrozil (LOPID) 600 MG tablet Take 600 mg by mouth 2 (two) times daily before a meal.    . glipiZIDE (GLUCOTROL) 10 MG tablet Take 10 mg by mouth 2 (two) times daily before a meal.    . ibuprofen (ADVIL,MOTRIN) 200 MG tablet Take 200 mg by mouth every 6 (six) hours as needed for pain.    Marland Kitchen. lisinopril (PRINIVIL,ZESTRIL) 20 MG tablet Take 20 mg by mouth daily.    . metFORMIN (GLUCOPHAGE) 1000 MG tablet 1 tab po bid and 1/2 tab at noon 225 tablet 1  . methocarbamol (ROBAXIN) 500 MG tablet Take 1 tablet (500  mg total) by mouth at bedtime as needed. 30 tablet 1  . Multiple Vitamin (MULTIVITAMIN) tablet Take 1 tablet by mouth daily.    . Omega-3 Fatty Acids (FISH OIL) 1000 MG CAPS Take 1 capsule by mouth 2 (two) times daily.    . tamsulosin (FLOMAX) 0.4 MG CAPS Take 0.4 mg by mouth daily.    . traZODone (DESYREL) 100 MG tablet Take 100 mg by mouth at bedtime.     No current facility-administered medications on file prior to visit.    No Known Allergies  Review of Systems  Review of Systems  Constitutional: Positive for malaise/fatigue. Negative for fever.  HENT: Negative for congestion.   Eyes: Negative for discharge.  Respiratory: Negative for shortness of breath.   Cardiovascular: Negative for chest pain, palpitations and leg swelling.  Gastrointestinal: Negative for nausea, abdominal pain and diarrhea.  Genitourinary: Negative for dysuria.  Musculoskeletal: Positive for back pain and joint pain. Negative for falls.  Skin: Negative for rash.  Neurological: Negative for loss of consciousness and headaches.  Endo/Heme/Allergies: Negative for polydipsia.  Psychiatric/Behavioral: Positive for depression. Negative for suicidal ideas. The patient is nervous/anxious and has insomnia.     Objective  BP 141/78 mmHg  Pulse 79  Temp(Src) 97.7 F (36.5 C) (Oral)  Ht 5\' 8"  (1.727 m)  Wt 221 lb 6.4 oz (100.426 kg)  BMI 33.67 kg/m2  SpO2 97%  Physical Exam  Physical Exam  Constitutional: He is oriented to person, place, and time and well-developed, well-nourished, and in no distress. No distress.  HENT:  Head: Normocephalic and atraumatic.  Eyes: Conjunctivae are normal.  Neck: Neck supple. No thyromegaly present.  Cardiovascular: Normal rate, regular rhythm and normal heart sounds.   No murmur heard. Pulmonary/Chest: Effort normal and breath sounds normal. No respiratory distress.  Abdominal: He exhibits no distension and no mass. There is no tenderness.  Musculoskeletal: He exhibits  no edema.  Decreased ROM in low back secondary to pain  Neurological: He is alert and oriented to person, place, and time.  Skin: Skin is warm.  Psychiatric: Memory, affect and judgment normal.    Lab Results  Component Value Date   TSH 1.900 11/04/2013  Lab Results  Component Value Date   WBC 4.0 11/04/2013   HGB 13.9 11/04/2013   HCT 40.3 11/04/2013   MCV 88.4 11/04/2013   PLT 186 11/04/2013   Lab Results  Component Value Date   CREATININE 0.64 11/04/2013   BUN 25* 11/04/2013   NA 135 11/04/2013   K 4.5 11/04/2013   CL 102 11/04/2013   CO2 23 11/04/2013   Lab Results  Component Value Date   ALT 20 11/04/2013   AST 18 11/04/2013   ALKPHOS 84 11/04/2013   BILITOT 0.5 11/04/2013   Lab Results  Component Value Date   CHOL 181 11/04/2013   Lab Results  Component Value Date   HDL 45 11/04/2013   Lab Results  Component Value Date   LDLCALC 108* 11/04/2013   Lab Results  Component Value Date   TRIG 140 11/04/2013   Lab Results  Component Value Date   CHOLHDL 4.0 11/04/2013     Assessment & Plan  Hypertension Well controlled, no changes to meds. Encouraged heart healthy diet such as the DASH diet and exercise as tolerated.   Diabetes mellitus without complication hgba1c acceptable, minimize simple carbs. Increase exercise as tolerated. Continue current meds  Back pain Worsening with radicular pain down both hips and legs. Referred to ortho for further consideration due to worsening pain, may continue meds prn.  PTSD (post-traumatic stress disorder) Worsening, is having nightmares. Has begun to participate in a support group at the [place] and he feels that is helping. Will increase the Sertraline to 150 mg daily and reassess at next visit  Obesity Encouraged DASH diet, decrease po intake and increase exercise as tolerated. Needs 7-8 hours of sleep nightly. Avoid trans fats, eat small, frequent meals every 4-5 hours with lean proteins, complex carbs and  healthy fats. Minimize simple carbs, GMO foods.

## 2014-06-29 NOTE — Assessment & Plan Note (Signed)
hgba1c acceptable, minimize simple carbs. Increase exercise as tolerated. Continue current meds 

## 2014-07-06 NOTE — Telephone Encounter (Signed)
Signed script faxed to pharmacy at [phone]

## 2014-07-15 ENCOUNTER — Ambulatory Visit: Payer: Medicare Other | Admitting: Rehabilitation

## 2014-07-15 DIAGNOSIS — M25511 Pain in right shoulder: Secondary | ICD-10-CM | POA: Diagnosis not present

## 2014-07-16 ENCOUNTER — Ambulatory Visit: Payer: Medicare Other | Admitting: Physical Therapy

## 2014-07-16 DIAGNOSIS — M25511 Pain in right shoulder: Secondary | ICD-10-CM | POA: Diagnosis not present

## 2014-07-21 ENCOUNTER — Ambulatory Visit: Payer: Medicare Other | Attending: Orthopaedic Surgery

## 2014-07-21 DIAGNOSIS — E119 Type 2 diabetes mellitus without complications: Secondary | ICD-10-CM | POA: Insufficient documentation

## 2014-07-21 DIAGNOSIS — Z9889 Other specified postprocedural states: Secondary | ICD-10-CM | POA: Insufficient documentation

## 2014-07-21 DIAGNOSIS — R609 Edema, unspecified: Secondary | ICD-10-CM | POA: Diagnosis not present

## 2014-07-21 DIAGNOSIS — M25611 Stiffness of right shoulder, not elsewhere classified: Secondary | ICD-10-CM | POA: Diagnosis present

## 2014-07-21 DIAGNOSIS — M25511 Pain in right shoulder: Secondary | ICD-10-CM | POA: Diagnosis present

## 2014-07-22 ENCOUNTER — Ambulatory Visit: Payer: Medicare Other | Admitting: Rehabilitation

## 2014-07-23 ENCOUNTER — Encounter: Payer: Self-pay | Admitting: Family Medicine

## 2014-07-23 ENCOUNTER — Ambulatory Visit (INDEPENDENT_AMBULATORY_CARE_PROVIDER_SITE_OTHER): Payer: Medicare Other | Admitting: Family Medicine

## 2014-07-23 ENCOUNTER — Ambulatory Visit: Payer: Medicare Other | Admitting: Rehabilitation

## 2014-07-23 VITALS — BP 123/72 | HR 74 | Temp 97.6°F | Ht 68.0 in | Wt 218.2 lb

## 2014-07-23 DIAGNOSIS — I10 Essential (primary) hypertension: Secondary | ICD-10-CM

## 2014-07-23 DIAGNOSIS — M25511 Pain in right shoulder: Secondary | ICD-10-CM | POA: Diagnosis not present

## 2014-07-23 DIAGNOSIS — E785 Hyperlipidemia, unspecified: Secondary | ICD-10-CM

## 2014-07-23 DIAGNOSIS — E669 Obesity, unspecified: Secondary | ICD-10-CM

## 2014-07-23 DIAGNOSIS — E119 Type 2 diabetes mellitus without complications: Secondary | ICD-10-CM

## 2014-07-23 DIAGNOSIS — Z87891 Personal history of nicotine dependence: Secondary | ICD-10-CM

## 2014-07-23 DIAGNOSIS — J449 Chronic obstructive pulmonary disease, unspecified: Secondary | ICD-10-CM

## 2014-07-23 LAB — CBC
HEMATOCRIT: 40.6 % (ref 39.0–52.0)
HEMOGLOBIN: 13.4 g/dL (ref 13.0–17.0)
MCHC: 33 g/dL (ref 30.0–36.0)
MCV: 91.1 fl (ref 78.0–100.0)
Platelets: 228 10*3/uL (ref 150.0–400.0)
RBC: 4.46 Mil/uL (ref 4.22–5.81)
RDW: 14.2 % (ref 11.5–15.5)
WBC: 5.8 10*3/uL (ref 4.0–10.5)

## 2014-07-23 LAB — COMPREHENSIVE METABOLIC PANEL
ALBUMIN: 4.8 g/dL (ref 3.5–5.2)
ALT: 71 U/L — ABNORMAL HIGH (ref 0–53)
AST: 55 U/L — ABNORMAL HIGH (ref 0–37)
Alkaline Phosphatase: 94 U/L (ref 39–117)
BUN: 26 mg/dL — ABNORMAL HIGH (ref 6–23)
CALCIUM: 9.8 mg/dL (ref 8.4–10.5)
CO2: 22 mEq/L (ref 19–32)
CREATININE: 0.8 mg/dL (ref 0.4–1.5)
Chloride: 103 mEq/L (ref 96–112)
GFR: 103.42 mL/min (ref >60.00)
Glucose, Bld: 223 mg/dL — ABNORMAL HIGH (ref 70–99)
POTASSIUM: 4.5 meq/L (ref 3.5–5.1)
SODIUM: 136 meq/L (ref 135–145)
Total Bilirubin: 0.7 mg/dL (ref 0.2–1.2)
Total Protein: 8.3 g/dL (ref 6.0–8.3)

## 2014-07-23 LAB — LIPID PANEL
Cholesterol: 209 mg/dL — ABNORMAL HIGH (ref 0–200)
HDL: 37.1 mg/dL — AB (ref >39.00)
LDL Cholesterol: 140 mg/dL — ABNORMAL HIGH (ref 0–99)
NonHDL: 171.9
TRIGLYCERIDES: 162 mg/dL — AB (ref 0.0–149.0)
Total CHOL/HDL Ratio: 6
VLDL: 32.4 mg/dL (ref 0.0–40.0)

## 2014-07-23 LAB — TSH: TSH: 1.6 u[IU]/mL (ref 0.35–4.50)

## 2014-07-23 MED ORDER — METFORMIN HCL 1000 MG PO TABS: 1000.0000 mg | ORAL_TABLET | Freq: Two times a day (BID) | ORAL | Status: AC

## 2014-07-23 NOTE — Assessment & Plan Note (Signed)
Surgery 4/15 with Dr Cleophas DunkerWhitfield, has sturggled with ongoing pain and debility since then, continues in PT and is cutting down on pain meds

## 2014-07-23 NOTE — Assessment & Plan Note (Signed)
Encouraged DASH diet, decrease po intake and increase exercise as tolerated. Needs 7-8 hours of sleep nightly. Avoid trans fats, eat small, frequent meals every 4-5 hours with lean proteins, complex carbs and healthy fats. Minimize simple carbs, GMO foods. 

## 2014-07-23 NOTE — Progress Notes (Signed)
Peter Adams  [id number] 1946-05-24 07/23/2014      Progress Note-Follow Up  Subjective  Chief Complaint  Chief Complaint  Patient presents with  . Follow-up    4 weeks    HPI  Patient is a 69 y.o. male in today for routine medical care. Doing well. Did not do well with watching the carbs over the Holidays. No polyuria or polydipsia. No recent illness. Continues to struggle with ongoing right shoulder pain but is improving with PT. Denies CP/palp/SOB/HA/congestion/fevers/GI or GU c/o. Taking meds as prescribed  Past Medical History  Diagnosis Date  . Measles as a child  . Mumps as a child  . Chicken pox as a child  . Diabetes mellitus without complication 69 yrs old    type 2  . Hyperlipidemia   . Poor circulation     in legs  . Hypertension   . Asthma   . COPD (chronic obstructive pulmonary disease) 69 yrs old  . Tobacco use 11/21/2012  . PTSD (post-traumatic stress disorder)   . BPH (benign prostatic hyperplasia) 11/21/2012  . Unspecified vitamin D deficiency 11/21/2012  . Vitamin B12 deficiency 11/21/2012  . Skin lesion of left arm 04/27/2013  . Back pain 05/21/2013  . Obesity, unspecified 05/26/2013  . Right shoulder pain 07/30/2013  . H/O tobacco use, presenting hazards to health 11/21/2012    1ppd since age 37     Past Surgical History  Procedure Laterality Date  . Appendectomy    . Foot surgery  40 yrs ago    left foot  . Rotator cuff repair  69 yrs old    left shoulder  . Nasal septum surgery  69 yrs old  . Vasectomy  82    Family History  Problem Relation Age of Onset  . Diabetes Mother     type 2  . Hypertension Mother   . Hyperlipidemia Mother   . Stroke Mother   . Diabetes Father     type 2  . Ulcers Father   . Hypertension Father   . Hyperlipidemia Father   . Heart attack Father     X 5  . Cancer Father     around aorta  . Heart disease Sister   . Heart disease Brother   . Diabetes Brother     type 2  . Diabetes Son     type 2  .  Diabetes Sister     type 2- diabetic coma  . Diabetes Sister     ?  Marland Kitchen Dementia Sister   . Cancer Brother 50    colon  . Diabetes Brother     type 2  . Leukemia Brother   . Dementia Brother     History   Social History  . Marital Status: Married    Spouse Name: N/A    Number of Children: N/A  . Years of Education: N/A   Occupational History  . Not on file.   Social History Main Topics  . Smoking status: Former Smoker -- 1.00 packs/day for 55 years    Types: Cigarettes    Quit date: 05/28/2013  . Smokeless tobacco: Never Used  . Alcohol Use: Yes     Comment: socially- hardly ever  . Drug Use: No  . Sexual Activity: Not on file   Other Topics Concern  . Not on file   Social History Narrative    Current Outpatient Prescriptions on File Prior to Visit  Medication Sig Dispense Refill  .  aspirin 81 MG tablet Take 81 mg by mouth daily.    . baclofen (LIORESAL) 10 MG tablet Take 1 tablet (10 mg total) by mouth 2 (two) times daily as needed for muscle spasms. 60 each 1  . Cholecalciferol (VITAMIN D-3) 1000 UNITS CAPS Take 1 capsule by mouth daily.    . cyanocobalamin 500 MCG tablet Take 500 mcg by mouth daily.    Marland Kitchen. gemfibrozil (LOPID) 600 MG tablet Take 600 mg by mouth 2 (two) times daily before a meal.    . glipiZIDE (GLUCOTROL) 10 MG tablet Take 10 mg by mouth 2 (two) times daily before a meal.    . ibuprofen (ADVIL,MOTRIN) 200 MG tablet Take 200 mg by mouth every 6 (six) hours as needed for pain.    Marland Kitchen. lisinopril (PRINIVIL,ZESTRIL) 20 MG tablet Take 20 mg by mouth daily.    . metFORMIN (GLUCOPHAGE) 1000 MG tablet 1 tab po bid and 1/2 tab at noon 225 tablet 1  . methocarbamol (ROBAXIN) 500 MG tablet Take 1 tablet (500 mg total) by mouth at bedtime as needed. 30 tablet 1  . Multiple Vitamin (MULTIVITAMIN) tablet Take 1 tablet by mouth daily.    . Omega-3 Fatty Acids (FISH OIL) 1000 MG CAPS Take 1 capsule by mouth 2 (two) times daily.    . sertraline (ZOLOFT) 100 MG tablet  Take 1.5 tablets (150 mg total) by mouth daily. 30 tablet 2  . tamsulosin (FLOMAX) 0.4 MG CAPS Take 0.4 mg by mouth daily.    . traZODone (DESYREL) 100 MG tablet Take 100 mg by mouth at bedtime.     No current facility-administered medications on file prior to visit.    No Known Allergies  Review of Systems  Review of Systems  Constitutional: Negative for fever and malaise/fatigue.  HENT: Negative for congestion.   Eyes: Negative for discharge.  Respiratory: Negative for shortness of breath.   Cardiovascular: Positive for claudication. Negative for chest pain, palpitations and leg swelling.  Gastrointestinal: Negative for nausea, abdominal pain and diarrhea.  Genitourinary: Negative for dysuria.  Musculoskeletal: Positive for joint pain. Negative for falls.       Right shoulder pain, in PT at present, had surgery on shoulder in April 2015 and has had trouble ever since, Surgeon is Dr Cleophas DunkerWhitfield  Skin: Negative for rash.  Neurological: Negative for loss of consciousness and headaches.  Endo/Heme/Allergies: Negative for polydipsia.  Psychiatric/Behavioral: Negative for depression and suicidal ideas. The patient is not nervous/anxious and does not have insomnia.     Objective  BP 123/72 mmHg  Pulse 74  Temp(Src) 97.6 F (36.4 C) (Oral)  Ht 5\' 8"  (1.727 m)  Wt 218 lb 3.2 oz (98.975 kg)  BMI 33.18 kg/m2  SpO2 97%  Physical Exam  Physical Exam  Constitutional: He is oriented to person, place, and time and well-developed, well-nourished, and in no distress. No distress.  HENT:  Head: Normocephalic and atraumatic.  Eyes: Conjunctivae are normal.  Neck: Neck supple. No thyromegaly present.  Cardiovascular: Normal rate, regular rhythm and normal heart sounds.   No murmur heard. Pulmonary/Chest: Effort normal and breath sounds normal. No respiratory distress.  Abdominal: He exhibits no distension and no mass. There is no tenderness.  Musculoskeletal: He exhibits no edema.    Neurological: He is alert and oriented to person, place, and time.  Skin: Skin is warm.  Psychiatric: Memory, affect and judgment normal.    Lab Results  Component Value Date   TSH 1.900 11/04/2013   Lab  Results  Component Value Date   WBC 4.0 11/04/2013   HGB 13.9 11/04/2013   HCT 40.3 11/04/2013   MCV 88.4 11/04/2013   PLT 186 11/04/2013   Lab Results  Component Value Date   CREATININE 0.64 11/04/2013   BUN 25* 11/04/2013   NA 135 11/04/2013   K 4.5 11/04/2013   CL 102 11/04/2013   CO2 23 11/04/2013   Lab Results  Component Value Date   ALT 20 11/04/2013   AST 18 11/04/2013   ALKPHOS 84 11/04/2013   BILITOT 0.5 11/04/2013   Lab Results  Component Value Date   CHOL 181 11/04/2013   Lab Results  Component Value Date   HDL 45 11/04/2013   Lab Results  Component Value Date   LDLCALC 108* 11/04/2013   Lab Results  Component Value Date   TRIG 140 11/04/2013   Lab Results  Component Value Date   CHOLHDL 4.0 11/04/2013     Assessment & Plan  Diabetes mellitus without complication A1C with House Call yesterday is 9.0. He used to take insulin through the [place] but does not remember which one. He will check at home and call so we can restart. hgba1c unacceptable, minimize simple carbs. Increase exercise as tolerated.  Right shoulder pain Surgery 4/15 with Dr Cleophas Dunker, has sturggled with ongoing pain and debility since then, continues in PT and is cutting down on pain meds  Hypertension Well controlled, no changes to meds. Encouraged heart healthy diet such as the DASH diet and exercise as tolerated.   H/O tobacco use, presenting hazards to health Quit in October in 2014  Obesity Encouraged DASH diet, decrease po intake and increase exercise as tolerated. Needs 7-8 hours of sleep nightly. Avoid trans fats, eat small, frequent meals every 4-5 hours with lean proteins, complex carbs and healthy fats. Minimize simple carbs, GMO foods.  COPD (chronic  obstructive pulmonary disease) No recent flares, no change in meds  Hyperlipidemia Encouraged heart healthy diet, increase exercise, avoid trans fats, consider a krill oil cap daily

## 2014-07-23 NOTE — Assessment & Plan Note (Signed)
Well controlled, no changes to meds. Encouraged heart healthy diet such as the DASH diet and exercise as tolerated.  °

## 2014-07-23 NOTE — Patient Instructions (Signed)
[facility] or pick a local pharmacy to get it at and let me and I will call in a prescription.  Check at home for insulin type and dosing instructions and call us so we can restart  Basic Carbohydrate Counting for Diabetes Mellitus Carbohydrate counting is a method for keeping track of the amount of carbohydrates you eat. Eating carbohydrates naturally increases the level of sugar (glucose) in your blood, so it is important for you to know the amount that is okay for you to have in every meal. Carbohydrate counting helps keep the level of glucose in your blood within normal limits. The amount of carbohydrates allowed is different for every person. A dietitian can help you calculate the amount that is right for you. Once you know the amount of carbohydrates you can have, you can count the carbohydrates in the foods you want to eat. Carbohydrates are found in the following foods:  Grains, such as breads and cereals.  Dried beans and soy products.  Starchy vegetables, such as potatoes, peas, and corn.  Fruit and fruit juices.  Milk and yogurt.  Sweets and snack foods, such as cake, cookies, candy, chips, soft drinks, and fruit drinks. CARBOHYDRATE COUNTING There are two ways to count the carbohydrates in your food. You can use either of the methods or a combination of both. Reading the "Nutrition Facts" on Packaged Food The "Nutrition Facts" is an area that is included on the labels of almost all packaged food and beverages in the [place]. It includes the serving size of that food or beverage and information about the nutrients in each serving of the food, including the grams (g) of carbohydrate per serving.  Decide the number of servings of this food or beverage that you will be able to eat or drink. Multiply that number of servings by the number of grams of carbohydrate that is listed on the label for that serving. The total will be the amount of carbohydrates you  will be having when you eat or drink this food or beverage. Learning Standard Serving Sizes of Food When you eat food that is not packaged or does not include "Nutrition Facts" on the label, you need to measure the servings in order to count the amount of carbohydrates.A serving of most carbohydrate-rich foods contains about 15 g of carbohydrates. The following list includes serving sizes of carbohydrate-rich foods that provide 15 g ofcarbohydrate per serving:   1 slice of bread (1 oz) or 1 six-inch tortilla.    of a hamburger bun or English muffin.  4-6 crackers.   cup unsweetened dry cereal.    cup hot cereal.   cup rice or pasta.    cup mashed potatoes or  of a large baked potato.  1 cup fresh fruit or one small piece of fruit.    cup canned or frozen fruit or fruit juice.  1 cup milk.   cup plain fat-free yogurt or yogurt sweetened with artificial sweeteners.   cup cooked dried beans or starchy vegetable, such as peas, corn, or potatoes.  Decide the number of standard-size servings that you will eat. Multiply that number of servings by 15 (the grams of carbohydrates in that serving). For example, if you eat 2 cups of strawberries, you will have eaten 2 servings and 30 g of carbohydrates (2 servings x 15 g = 30 g). For foods such as soups and casseroles, in which more than one food is mixed in, you will need  to count the carbohydrates in each food that is included. EXAMPLE OF CARBOHYDRATE COUNTING Sample Dinner  3 oz chicken breast.   cup of brown rice.   cup of corn.  1 cup milk.   1 cup strawberries with sugar-free whipped topping.  Carbohydrate Calculation Step 1: Identify the foods that contain carbohydrates:   Rice.   Corn.   Milk.   Strawberries. Step 2:Calculate the number of servings eaten of each:   2 servings of rice.   1 serving of corn.   1 serving of milk.   1 serving of strawberries. Step 3: Multiply each of those  number of servings by 15 g:   2 servings of rice x 15 g = 30 g.   1 serving of corn x 15 g = 15 g.   1 serving of milk x 15 g = 15 g.   1 serving of strawberries x 15 g = 15 g. Step 4: Add together all of the amounts to find the total grams of carbohydrates eaten: 30 g + 15 g + 15 g + 15 g = 75 g. Document Released: 07/03/2005 Document Revised: 11/17/2013 Document Reviewed: 05/30/2013 [facility] Patient Information 2015 [place], [place]. This information is not intended to replace advice given to you by your health care provider. Make sure you discuss any questions you have with your health care provider.

## 2014-07-23 NOTE — Assessment & Plan Note (Signed)
A1C with House Call yesterday is 9.0. [facility] but does not remember which one. He will check at home and call so we can restart. hgba1c unacceptable, minimize simple carbs. Increase exercise as tolerated.

## 2014-07-23 NOTE — Assessment & Plan Note (Signed)
Quit in October in 2014

## 2014-07-23 NOTE — Progress Notes (Signed)
Pre visit review using our clinic review tool, if applicable. No additional management support is needed unless otherwise documented below in the visit note. 

## 2014-07-24 ENCOUNTER — Ambulatory Visit: Payer: Medicare Other | Admitting: Rehabilitation

## 2014-07-24 DIAGNOSIS — M25511 Pain in right shoulder: Secondary | ICD-10-CM | POA: Diagnosis not present

## 2014-07-26 NOTE — Assessment & Plan Note (Signed)
Encouraged heart healthy diet, increase exercise, avoid trans fats, consider a krill oil cap daily 

## 2014-07-26 NOTE — Assessment & Plan Note (Signed)
No recent flares, no change in meds 

## 2014-07-29 ENCOUNTER — Ambulatory Visit: Payer: Medicare Other | Admitting: Rehabilitation

## 2014-07-30 ENCOUNTER — Ambulatory Visit: Payer: Medicare Other | Admitting: Rehabilitation

## 2014-07-30 DIAGNOSIS — M25511 Pain in right shoulder: Secondary | ICD-10-CM | POA: Diagnosis not present

## 2014-07-31 ENCOUNTER — Ambulatory Visit: Payer: Medicare Other | Admitting: Rehabilitation

## 2014-07-31 DIAGNOSIS — M25511 Pain in right shoulder: Secondary | ICD-10-CM | POA: Diagnosis not present

## 2014-08-03 ENCOUNTER — Ambulatory Visit: Payer: Medicare Other

## 2014-08-03 DIAGNOSIS — M25511 Pain in right shoulder: Secondary | ICD-10-CM | POA: Diagnosis not present

## 2014-08-05 ENCOUNTER — Ambulatory Visit: Payer: Medicare Other | Admitting: Rehabilitation

## 2014-08-05 DIAGNOSIS — M25511 Pain in right shoulder: Secondary | ICD-10-CM | POA: Diagnosis not present

## 2014-08-06 ENCOUNTER — Ambulatory Visit: Payer: Medicare Other | Admitting: Rehabilitation

## 2014-08-06 DIAGNOSIS — M25511 Pain in right shoulder: Secondary | ICD-10-CM | POA: Diagnosis not present

## 2014-08-10 ENCOUNTER — Ambulatory Visit: Payer: Medicare Other | Admitting: Rehabilitation

## 2014-08-12 ENCOUNTER — Ambulatory Visit: Payer: Medicare Other | Admitting: Rehabilitation

## 2014-08-12 ENCOUNTER — Telehealth: Payer: Self-pay

## 2014-08-12 DIAGNOSIS — M25511 Pain in right shoulder: Secondary | ICD-10-CM | POA: Diagnosis not present

## 2014-08-12 DIAGNOSIS — E7800 Pure hypercholesterolemia, unspecified: Secondary | ICD-10-CM

## 2014-08-12 MED ORDER — ATORVASTATIN CALCIUM 10 MG PO TABS: 10.0000 mg | ORAL_TABLET | Freq: Every day | ORAL | Status: DC

## 2014-08-12 NOTE — Telephone Encounter (Signed)
Was there any DM meds you wanted to send in for him? [facility].   Gave patient copies of labs to take with him to [facility].

## 2014-08-12 NOTE — Telephone Encounter (Signed)
I did not evaluate this somehow. So if he needs a refill on his current meds I am happy to refil til he sees [facility]

## 2014-08-12 NOTE — Telephone Encounter (Signed)
-----   Message from Bradd CanaryStacey A Blyth, MD sent at 07/23/2014  8:03 PM EST ----- Notify cholesterol is up some recommend start Atorvasatin 10 mg po qhs, disp #30 with 3 rf. Also liver functions up some, minimize the carbohydrates like we discussed and this will likely get better. It is likely to fatty liver disease

## 2014-08-13 NOTE — Telephone Encounter (Signed)
Called and spoke with the pt and his wife and informed them of the note below.    Both understood and the wife stated that he does not need any of his meds refilled and he can wait [facility] for the DM.//AB/CMA

## 2014-08-14 ENCOUNTER — Ambulatory Visit: Payer: Medicare Other | Admitting: Rehabilitation

## 2014-08-14 DIAGNOSIS — M25511 Pain in right shoulder: Secondary | ICD-10-CM | POA: Diagnosis not present

## 2014-10-22 ENCOUNTER — Ambulatory Visit (INDEPENDENT_AMBULATORY_CARE_PROVIDER_SITE_OTHER): Payer: Medicare Other | Admitting: Family Medicine

## 2014-10-22 ENCOUNTER — Encounter: Payer: Self-pay | Admitting: Family Medicine

## 2014-10-22 VITALS — BP 124/76 | HR 91 | Temp 97.8°F | Resp 18 | Ht 68.0 in | Wt 222.0 lb

## 2014-10-22 DIAGNOSIS — E559 Vitamin D deficiency, unspecified: Secondary | ICD-10-CM

## 2014-10-22 DIAGNOSIS — J449 Chronic obstructive pulmonary disease, unspecified: Secondary | ICD-10-CM

## 2014-10-22 DIAGNOSIS — E1169 Type 2 diabetes mellitus with other specified complication: Secondary | ICD-10-CM

## 2014-10-22 DIAGNOSIS — E669 Obesity, unspecified: Secondary | ICD-10-CM

## 2014-10-22 DIAGNOSIS — F4310 Post-traumatic stress disorder, unspecified: Secondary | ICD-10-CM

## 2014-10-22 DIAGNOSIS — E119 Type 2 diabetes mellitus without complications: Secondary | ICD-10-CM | POA: Diagnosis not present

## 2014-10-22 DIAGNOSIS — E538 Deficiency of other specified B group vitamins: Secondary | ICD-10-CM

## 2014-10-22 DIAGNOSIS — I10 Essential (primary) hypertension: Secondary | ICD-10-CM | POA: Diagnosis not present

## 2014-10-22 DIAGNOSIS — E785 Hyperlipidemia, unspecified: Secondary | ICD-10-CM

## 2014-10-22 NOTE — Assessment & Plan Note (Signed)
Well controlled, no changes to meds. Encouraged heart healthy diet such as the DASH diet and exercise as tolerated.  °

## 2014-10-22 NOTE — Assessment & Plan Note (Signed)
Check Vitamin B12 level today

## 2014-10-22 NOTE — Progress Notes (Signed)
Peter Adams  [id number] Sep 08, 1945 10/22/2014      Progress Note-Follow Up  Subjective  Chief Complaint  Chief Complaint  Patient presents with  . Follow-up    3 mo. Also having sinus pain and pressure [facility]    HPI  Patient is a 69 y.o. male in today for routine medical care. Patient in today for follow up. [facility] [place] [facility]. Has an appt with them tomorrow. Reports he does not check his sugar often. Denies polyuria and polydypsia. Struggling with depression and PTSD today. Denies CP/palp/SOB/HA/congestion/fevers/GI or GU c/o. Taking meds as prescribed  Past Medical History  Diagnosis Date  . Measles as a child  . Mumps as a child  . Chicken pox as a child  . Diabetes mellitus without complication 69 yrs old    type 2  . Hyperlipidemia   . Poor circulation     in legs  . Hypertension   . Asthma   . COPD (chronic obstructive pulmonary disease) 69 yrs old  . Tobacco use 11/21/2012  . PTSD (post-traumatic stress disorder)   . BPH (benign prostatic hyperplasia) 11/21/2012  . Unspecified vitamin D deficiency 11/21/2012  . Vitamin B12 deficiency 11/21/2012  . Skin lesion of left arm 04/27/2013  . Back pain 05/21/2013  . Obesity, unspecified 05/26/2013  . Right shoulder pain 07/30/2013  . H/O tobacco use, presenting hazards to health 11/21/2012    1ppd since age 69     Past Surgical History  Procedure Laterality Date  . Appendectomy    . Foot surgery  40 yrs ago    left foot  . Rotator cuff repair  69 yrs old    left shoulder  . Nasal septum surgery  69 yrs old  . Vasectomy  [age]    Family History  Problem Relation Age of Onset  . Diabetes Mother     type 2  . Hypertension Mother   . Hyperlipidemia Mother   . Stroke Mother   . Diabetes Father     type 2  . Ulcers Father   . Hypertension Father   . Hyperlipidemia Father   . Heart attack Father     X 5  . Cancer Father     around aorta  . Heart disease Sister   . Heart  disease Brother   . Diabetes Brother     type 2  . Diabetes Son     type 2  . Diabetes Sister     type 2- diabetic coma  . Diabetes Sister     ?  Marland Kitchen. Dementia Sister   . Cancer Brother 50    colon  . Diabetes Brother     type 2  . Leukemia Brother   . Dementia Brother     History   Social History  . Marital Status: Married    Spouse Name: N/A  . Number of Children: N/A  . Years of Education: N/A   Occupational History  . Not on file.   Social History Main Topics  . Smoking status: Former Smoker -- 1.00 packs/day for 55 years    Types: Cigarettes    Quit date: 05/28/2013  . Smokeless tobacco: Never Used  . Alcohol Use: Yes     Comment: socially- hardly ever  . Drug Use: No  . Sexual Activity: Not on file   Other Topics Concern  . Not on file   Social History Narrative  Current Outpatient Prescriptions on File Prior to Visit  Medication Sig Dispense Refill  . aspirin 81 MG tablet Take 81 mg by mouth daily.    . Cholecalciferol (VITAMIN D-3) 1000 UNITS CAPS Take 1 capsule by mouth daily.    Marland Kitchen glipiZIDE (GLUCOTROL) 10 MG tablet Take 10 mg by mouth 2 (two) times daily before a meal.    . ibuprofen (ADVIL,MOTRIN) 200 MG tablet Take 200 mg by mouth every 6 (six) hours as needed for pain.    Marland Kitchen lisinopril (PRINIVIL,ZESTRIL) 20 MG tablet Take 20 mg by mouth daily.    . metFORMIN (GLUCOPHAGE) 1000 MG tablet Take 1 tablet (1,000 mg total) by mouth 2 (two) times daily with a meal. 1 tab po bid and 1/2 tab at noon 225 tablet 1  . Multiple Vitamin (MULTIVITAMIN) tablet Take 1 tablet by mouth daily.    . Omega-3 Fatty Acids (FISH OIL) 1000 MG CAPS Take 1 capsule by mouth 2 (two) times daily.    . sertraline (ZOLOFT) 100 MG tablet Take 1.5 tablets (150 mg total) by mouth daily. 30 tablet 2  . tamsulosin (FLOMAX) 0.4 MG CAPS Take 0.4 mg by mouth daily.    . traZODone (DESYREL) 100 MG tablet Take 100 mg by mouth at bedtime.     No current facility-administered medications on  file prior to visit.    No Known Allergies  Review of Systems  Review of Systems  Constitutional: Positive for malaise/fatigue. Negative for fever.  HENT: Negative for congestion.   Eyes: Negative for discharge.  Respiratory: Negative for shortness of breath.   Cardiovascular: Negative for chest pain, palpitations and leg swelling.  Gastrointestinal: Negative for nausea, abdominal pain and diarrhea.  Genitourinary: Negative for dysuria.  Musculoskeletal: Negative for falls.  Skin: Negative for rash.  Neurological: Negative for loss of consciousness and headaches.  Endo/Heme/Allergies: Negative for polydipsia.  Psychiatric/Behavioral: Positive for depression. Negative for suicidal ideas, hallucinations and substance abuse. The patient is nervous/anxious. The patient does not have insomnia.     Objective  BP 124/76 mmHg  Pulse 91  Temp(Src) 97.8 F (36.6 C) (Oral)  Resp 18  Ht  (1.727 m)  Wt 222 lb (100.699 kg)  BMI 33.76 kg/m2  SpO2 97%  Physical Exam  Physical Exam  Constitutional: He is oriented to person, place, and time and well-developed, well-nourished, and in no distress. No distress.  HENT:  Head: Normocephalic and atraumatic.  Eyes: Conjunctivae are normal.  Neck: Neck supple. No thyromegaly present.  Cardiovascular: Normal rate, regular rhythm and normal heart sounds.   No murmur heard. Pulmonary/Chest: Effort normal and breath sounds normal. No respiratory distress.  Abdominal: He exhibits no distension and no mass. There is no tenderness.  Musculoskeletal: He exhibits no edema.  Neurological: He is alert and oriented to person, place, and time.  Skin: Skin is warm.  Psychiatric: Memory, affect and judgment normal.    Lab Results  Component Value Date   TSH 1.60 07/23/2014   Lab Results  Component Value Date   WBC 5.8 07/23/2014   HGB 13.4 07/23/2014   HCT 40.6 07/23/2014   MCV 91.1 07/23/2014   PLT 228.0 07/23/2014   Lab Results    Component Value Date   CREATININE 0.8 07/23/2014   BUN 26* 07/23/2014   NA 136 07/23/2014   K 4.5 07/23/2014   CL 103 07/23/2014   CO2 22 07/23/2014   Lab Results  Component Value Date   ALT 71* 07/23/2014   AST  55* 07/23/2014   ALKPHOS 94 07/23/2014   BILITOT 0.7 07/23/2014   Lab Results  Component Value Date   CHOL 209* 07/23/2014   Lab Results  Component Value Date   HDL 37.10* 07/23/2014   Lab Results  Component Value Date   LDLCALC 140* 07/23/2014   Lab Results  Component Value Date   TRIG 162.0* 07/23/2014   Lab Results  Component Value Date   CHOLHDL 6 07/23/2014     Assessment & Plan   Hypertension Well controlled, no changes to meds. Encouraged heart healthy diet such as the DASH diet and exercise as tolerated.    COPD (chronic obstructive pulmonary disease) No recent exacerbations. No changes   Diabetes mellitus without complication [facility], has an appt with them tomorrow. Will check A1C today and adjust Lantus as needed. Minimize simple carbs   Hyperlipidemia Tolerating statin, encouraged heart healthy diet, avoid trans fats, minimize simple carbs and saturated fats. Increase exercise as tolerated   Vitamin D deficiency Check Vitamin D level today   Obesity Encouraged DASH diet, decrease po intake and increase exercise as tolerated. Needs 7-8 hours of sleep nightly. Avoid trans fats, eat small, frequent meals every 4-5 hours with lean proteins, complex carbs and healthy fats. Minimize simple carbs   Vitamin B12 deficiency Check Vitamin B12 level today

## 2014-10-22 NOTE — Assessment & Plan Note (Signed)
Encouraged DASH diet, decrease po intake and increase exercise as tolerated. Needs 7-8 hours of sleep nightly. Avoid trans fats, eat small, frequent meals every 4-5 hours with lean proteins, complex carbs and healthy fats. Minimize simple carbs 

## 2014-10-22 NOTE — Patient Instructions (Signed)

## 2014-10-22 NOTE — Assessment & Plan Note (Signed)
[facility], has an appt with them tomorrow. Will check A1C today and adjust Lantus as needed. Minimize simple carbs

## 2014-10-22 NOTE — Assessment & Plan Note (Signed)
No recent exacerbations. No changes

## 2014-10-22 NOTE — Assessment & Plan Note (Signed)
Tolerating statin, encouraged heart healthy diet, avoid trans fats, minimize simple carbs and saturated fats. Increase exercise as tolerated 

## 2014-10-22 NOTE — Assessment & Plan Note (Signed)
Check Vitamin D level today.  

## 2014-11-23 ENCOUNTER — Telehealth: Payer: Self-pay | Admitting: Family Medicine

## 2014-11-23 NOTE — Telephone Encounter (Signed)
Caller: [name] Rel to pt: Pt spouse Ph# [phone]  Reason for call: dropped off letter last week that needed updated/corrected to include what the [facility] needs. Dr. Abner GreenspanBlyth had included more information that was [facility]. They are very particular in what information they would like from us. She states it was dropped off a week ago and no one has followed up. I checked files in the front office and no letter is present. Please follow up with pt wife, Peter DandyMary.

## 2014-11-24 ENCOUNTER — Telehealth: Payer: Self-pay | Admitting: Family Medicine

## 2014-11-24 NOTE — Telephone Encounter (Signed)
Called patient; left message to call back.

## 2014-11-24 NOTE — Telephone Encounter (Signed)
I agreed to write him a letter for the [facility] at his last visit but I warned him I am not a [facility] doc or a disability doc so I will likely not be listened to during any reconsideration process at [facility]. I did see a form I have never done before and do not feel competent to complete the form. If they want to come in and discuss they can. I discussed this with Paperwork staff last week.

## 2014-11-24 NOTE — Telephone Encounter (Signed)
Talked with Corrie DandyMary, pt wife, & have scheduled appt with Dr. Abner GreenspanBlyth to review VA paperwork on 11/26/14 1:15pm.

## 2014-11-24 NOTE — Telephone Encounter (Signed)
Patients wife informed of PCP instructions on paperwork and did agree to schedule appointment.

## 2014-11-26 ENCOUNTER — Ambulatory Visit (INDEPENDENT_AMBULATORY_CARE_PROVIDER_SITE_OTHER): Payer: Medicare Other | Admitting: Family Medicine

## 2014-11-26 ENCOUNTER — Encounter: Payer: Self-pay | Admitting: Family Medicine

## 2014-11-26 VITALS — BP 120/82 | HR 80 | Temp 98.3°F | Ht 68.0 in | Wt 223.2 lb

## 2014-11-26 DIAGNOSIS — E119 Type 2 diabetes mellitus without complications: Secondary | ICD-10-CM

## 2014-11-26 DIAGNOSIS — E1169 Type 2 diabetes mellitus with other specified complication: Secondary | ICD-10-CM

## 2014-11-26 DIAGNOSIS — F4310 Post-traumatic stress disorder, unspecified: Secondary | ICD-10-CM

## 2014-11-26 DIAGNOSIS — E669 Obesity, unspecified: Secondary | ICD-10-CM | POA: Diagnosis not present

## 2014-11-26 DIAGNOSIS — I10 Essential (primary) hypertension: Secondary | ICD-10-CM | POA: Diagnosis not present

## 2014-11-26 DIAGNOSIS — E782 Mixed hyperlipidemia: Secondary | ICD-10-CM | POA: Diagnosis not present

## 2014-11-26 LAB — COMPREHENSIVE METABOLIC PANEL
ALT: 44 U/L (ref 0–53)
AST: 31 U/L (ref 0–37)
Albumin: 4.3 g/dL (ref 3.5–5.2)
Alkaline Phosphatase: 100 U/L (ref 39–117)
BUN: 15 mg/dL (ref 6–23)
CALCIUM: 9.9 mg/dL (ref 8.4–10.5)
CHLORIDE: 102 meq/L (ref 96–112)
CO2: 28 meq/L (ref 19–32)
Creatinine, Ser: 0.67 mg/dL (ref 0.40–1.50)
GFR: 124.95 mL/min (ref >60.00)
Glucose, Bld: 238 mg/dL — ABNORMAL HIGH (ref 70–99)
Potassium: 4.2 mEq/L (ref 3.5–5.1)
SODIUM: 135 meq/L (ref 135–145)
TOTAL PROTEIN: 7.2 g/dL (ref 6.0–8.3)
Total Bilirubin: 0.5 mg/dL (ref 0.2–1.2)

## 2014-11-26 LAB — LIPID PANEL
Cholesterol: 121 mg/dL (ref 0–200)
HDL: 41.3 mg/dL (ref >39.00)
LDL CALC: 49 mg/dL (ref 0–99)
NonHDL: 79.7
Total CHOL/HDL Ratio: 3
Triglycerides: 153 mg/dL — ABNORMAL HIGH (ref 0.0–149.0)
VLDL: 30.6 mg/dL (ref 0.0–40.0)

## 2014-11-26 LAB — CBC
HCT: 40 % (ref 39.0–52.0)
HEMOGLOBIN: 13.6 g/dL (ref 13.0–17.0)
MCHC: 34.1 g/dL (ref 30.0–36.0)
MCV: 85.6 fl (ref 78.0–100.0)
Platelets: 188 10*3/uL (ref 150.0–400.0)
RBC: 4.67 Mil/uL (ref 4.22–5.81)
RDW: 14.3 % (ref 11.5–15.5)
WBC: 4.8 10*3/uL (ref 4.0–10.5)

## 2014-11-26 LAB — HEMOGLOBIN A1C: Hgb A1c MFr Bld: 8.7 % — ABNORMAL HIGH (ref 4.6–6.5)

## 2014-11-26 LAB — TSH: TSH: 1.42 u[IU]/mL (ref 0.35–4.50)

## 2014-11-26 NOTE — Progress Notes (Signed)
Pre visit review using our clinic review tool, if applicable. No additional management support is needed unless otherwise documented below in the visit note. 

## 2014-11-26 NOTE — Patient Instructions (Signed)
Can increase Lantus by 2 units every 3 days Blood sugar is always greater than 120 consistently and numbers generally remain above 150, write down numbers   Basic Carbohydrate Counting for Diabetes Mellitus Carbohydrate counting is a method for keeping track of the amount of carbohydrates you eat. Eating carbohydrates naturally increases the level of sugar (glucose) in your blood, so it is important for you to know the amount that is okay for you to have in every meal. Carbohydrate counting helps keep the level of glucose in your blood within normal limits. The amount of carbohydrates allowed is different for every person. A dietitian can help you calculate the amount that is right for you. Once you know the amount of carbohydrates you can have, you can count the carbohydrates in the foods you want to eat. Carbohydrates are found in the following foods:  Grains, such as breads and cereals.  Dried beans and soy products.  Starchy vegetables, such as potatoes, peas, and corn.  Fruit and fruit juices.  Milk and yogurt.  Sweets and snack foods, such as cake, cookies, candy, chips, soft drinks, and fruit drinks. CARBOHYDRATE COUNTING There are two ways to count the carbohydrates in your food. You can use either of the methods or a combination of both. Reading the "Nutrition Facts" on Packaged Food The "Nutrition Facts" is an area that is included on the labels of almost all packaged food and beverages in the [place]. It includes the serving size of that food or beverage and information about the nutrients in each serving of the food, including the grams (g) of carbohydrate per serving.  Decide the number of servings of this food or beverage that you will be able to eat or drink. Multiply that number of servings by the number of grams of carbohydrate that is listed on the label for that serving. The total will be the amount of carbohydrates you will be having when you eat or drink this food or  beverage. Learning Standard Serving Sizes of Food When you eat food that is not packaged or does not include "Nutrition Facts" on the label, you need to measure the servings in order to count the amount of carbohydrates.A serving of most carbohydrate-rich foods contains about 15 g of carbohydrates. The following list includes serving sizes of carbohydrate-rich foods that provide 15 g ofcarbohydrate per serving:   1 slice of bread (1 oz) or 1 six-inch tortilla.    of a hamburger bun or English muffin.  4-6 crackers.   cup unsweetened dry cereal.    cup hot cereal.   cup rice or pasta.    cup mashed potatoes or  of a large baked potato.  1 cup fresh fruit or one small piece of fruit.    cup canned or frozen fruit or fruit juice.  1 cup milk.   cup plain fat-free yogurt or yogurt sweetened with artificial sweeteners.   cup cooked dried beans or starchy vegetable, such as peas, corn, or potatoes.  Decide the number of standard-size servings that you will eat. Multiply that number of servings by 15 (the grams of carbohydrates in that serving). For example, if you eat 2 cups of strawberries, you will have eaten 2 servings and 30 g of carbohydrates (2 servings x 15 g = 30 g). For foods such as soups and casseroles, in which more than one food is mixed in, you will need to count the carbohydrates in each food that is included. EXAMPLE OF CARBOHYDRATE COUNTING  Sample Dinner  3 oz chicken breast.   cup of brown rice.   cup of corn.  1 cup milk.   1 cup strawberries with sugar-free whipped topping.  Carbohydrate Calculation Step 1: Identify the foods that contain carbohydrates:   Rice.   Corn.   Milk.   Strawberries. Step 2:Calculate the number of servings eaten of each:   2 servings of rice.   1 serving of corn.   1 serving of milk.   1 serving of strawberries. Step 3: Multiply each of those number of servings by 15 g:   2 servings of  rice x 15 g = 30 g.   1 serving of corn x 15 g = 15 g.   1 serving of milk x 15 g = 15 g.   1 serving of strawberries x 15 g = 15 g. Step 4: Add together all of the amounts to find the total grams of carbohydrates eaten: 30 g + 15 g + 15 g + 15 g = 75 g. Document Released: 07/03/2005 Document Revised: 11/17/2013 Document Reviewed: 05/30/2013 [facility] Patient Information 2015 [place], [place]. This information is not intended to replace advice given to you by your health care provider. Make sure you discuss any questions you have with your health care provider.

## 2014-12-06 ENCOUNTER — Encounter: Payer: Self-pay | Admitting: Family Medicine

## 2014-12-06 NOTE — Assessment & Plan Note (Signed)
Well controlled, no changes to meds. Encouraged heart healthy diet such as the DASH diet and exercise as tolerated.  °

## 2014-12-06 NOTE — Assessment & Plan Note (Signed)
hgba1c unacceptable, minimize simple carbs. Increase exercise as tolerated. Continue current meds increase Lantus by 2 units, [facility]

## 2014-12-06 NOTE — Progress Notes (Signed)
Peter Adams  [id number] 1946/05/02 12/06/2014      Progress Note-Follow Up  Subjective  Chief Complaint  Chief Complaint  Patient presents with  . Follow-up    [facility]    HPI  Patient is in today with his wife with persistent concernysical illness.  regarding his worsening PTSD and anxiety. He is having trouble with concentration and anhedonia. Is having trouble with insomnia. No suicidal ideation. Increasing agitation. No recent physical illness. Denies CP/palp/SOB/HA/congestion/fevers/GI or GU c/o. Taking meds as prescribed Patient is a 70 y.o. male in today for routine medical care.  Past Medical History  Diagnosis Date  . Measles as a child  . Mumps as a child  . Chicken pox as a child  . Diabetes mellitus without complication 69 yrs old    type 2  . Hyperlipidemia   . Poor circulation     in legs  . Hypertension   . Asthma   . COPD (chronic obstructive pulmonary disease) 69 yrs old  . Tobacco use 11/21/2012  . PTSD (post-traumatic stress disorder)   . BPH (benign prostatic hyperplasia) 11/21/2012  . Unspecified vitamin D deficiency 11/21/2012  . Vitamin B12 deficiency 11/21/2012  . Skin lesion of left arm 04/27/2013  . Back pain 05/21/2013  . Obesity, unspecified 05/26/2013  . Right shoulder pain 07/30/2013  . H/O tobacco use, presenting hazards to health 11/21/2012    1ppd since age 31   . Diabetes mellitus type 2 in obese     type 2 Has his eye exam at [facility] in [place] in 9/14     Past Surgical History  Procedure Laterality Date  . Appendectomy    . Foot surgery  40 yrs ago    left foot  . Rotator cuff repair  69 yrs old    left shoulder  . Nasal septum surgery  69 yrs old  . Vasectomy  24    Family History  Problem Relation Age of Onset  . Diabetes Mother     type 2  . Hypertension Mother   . Hyperlipidemia Mother   . Stroke Mother   . Diabetes Father     type 2  . Ulcers Father   . Hypertension Father   . Hyperlipidemia Father   . Heart  attack Father     X 5  . Cancer Father     around aorta  . Heart disease Sister   . Heart disease Brother   . Diabetes Brother     type 2  . Diabetes Son     type 2  . Diabetes Sister     type 2- diabetic coma  . Diabetes Sister     ?  Marland Kitchen Dementia Sister   . Cancer Brother 50    colon  . Diabetes Brother     type 2  . Leukemia Brother   . Dementia Brother     History   Social History  . Marital Status: Married    Spouse Name: N/A  . Number of Children: N/A  . Years of Education: N/A   Occupational History  . Not on file.   Social History Main Topics  . Smoking status: Former Smoker -- 1.00 packs/day for 55 years    Types: Cigarettes    Quit date: 05/28/2013  . Smokeless tobacco: Never Used  . Alcohol Use: Yes     Comment: socially- hardly ever  . Drug Use: No  . Sexual Activity: Not on file  Other Topics Concern  . Not on file   Social History Narrative    Current Outpatient Prescriptions on File Prior to Visit  Medication Sig Dispense Refill  . aspirin 81 MG tablet Take 81 mg by mouth daily.    . Cholecalciferol (VITAMIN D-3) 1000 UNITS CAPS Take 1 capsule by mouth daily.    Marland Kitchen glipiZIDE (GLUCOTROL) 10 MG tablet Take 10 mg by mouth 2 (two) times daily before a meal.    . ibuprofen (ADVIL,MOTRIN) 200 MG tablet Take 200 mg by mouth every 6 (six) hours as needed for pain.    Marland Kitchen insulin glargine (LANTUS) 100 UNIT/ML injection Inject 10 Units into the skin at bedtime.    Marland Kitchen lisinopril (PRINIVIL,ZESTRIL) 20 MG tablet Take 20 mg by mouth daily.    . metFORMIN (GLUCOPHAGE) 1000 MG tablet Take 1 tablet (1,000 mg total) by mouth 2 (two) times daily with a meal. 1 tab po bid and 1/2 tab at noon 225 tablet 1  . Multiple Vitamin (MULTIVITAMIN) tablet Take 1 tablet by mouth daily.    . Omega-3 Fatty Acids (FISH OIL) 1000 MG CAPS Take 1 capsule by mouth 2 (two) times daily.    . sertraline (ZOLOFT) 100 MG tablet Take 1.5 tablets (150 mg total) by mouth daily. 30 tablet 2    . tamsulosin (FLOMAX) 0.4 MG CAPS Take 0.4 mg by mouth daily.    . traZODone (DESYREL) 100 MG tablet Take 100 mg by mouth at bedtime.     No current facility-administered medications on file prior to visit.    No Known Allergies  Review of Systems  Review of Systems  Constitutional: Negative for fever and malaise/fatigue.  HENT: Negative for congestion.   Eyes: Negative for discharge.  Respiratory: Negative for shortness of breath.   Cardiovascular: Negative for chest pain, palpitations and leg swelling.  Gastrointestinal: Negative for nausea, abdominal pain and diarrhea.  Genitourinary: Negative for dysuria.  Musculoskeletal: Negative for falls.  Skin: Negative for rash.  Neurological: Negative for loss of consciousness and headaches.  Endo/Heme/Allergies: Negative for polydipsia.  Psychiatric/Behavioral: Positive for depression. Negative for suicidal ideas. The patient is nervous/anxious and has insomnia.     Objective  BP 120/82 mmHg  Pulse 80  Temp(Src) 98.3 F (36.8 C) (Oral)  Ht  (1.727 m)  Wt 223 lb 4 oz (101.266 kg)  BMI 33.95 kg/m2  SpO2 96%  Physical Exam  Physical Exam  Constitutional: He is oriented to person, place, and time and well-developed, well-nourished, and in no distress. No distress.  HENT:  Head: Normocephalic and atraumatic.  Eyes: Conjunctivae are normal.  Neck: Neck supple. No thyromegaly present.  Cardiovascular: Normal rate, regular rhythm and normal heart sounds.   No murmur heard. Pulmonary/Chest: Effort normal and breath sounds normal. No respiratory distress.  Abdominal: He exhibits no distension and no mass. There is no tenderness.  Musculoskeletal: He exhibits no edema.  Neurological: He is alert and oriented to person, place, and time.  Skin: Skin is warm.  Psychiatric: Memory, affect and judgment normal.    Lab Results  Component Value Date   TSH 1.42 11/26/2014   Lab Results  Component Value Date   WBC 4.8  11/26/2014   HGB 13.6 11/26/2014   HCT 40.0 11/26/2014   MCV 85.6 11/26/2014   PLT 188.0 11/26/2014   Lab Results  Component Value Date   CREATININE 0.67 11/26/2014   BUN 15 11/26/2014   NA 135 11/26/2014   K 4.2 11/26/2014  CL 102 11/26/2014   CO2 28 11/26/2014   Lab Results  Component Value Date   ALT 44 11/26/2014   AST 31 11/26/2014   ALKPHOS 100 11/26/2014   BILITOT 0.5 11/26/2014   Lab Results  Component Value Date   CHOL 121 11/26/2014   Lab Results  Component Value Date   HDL 41.30 11/26/2014   Lab Results  Component Value Date   LDLCALC 49 11/26/2014   Lab Results  Component Value Date   TRIG 153.0* 11/26/2014   Lab Results  Component Value Date   CHOLHDL 3 11/26/2014     Assessment & Plan  Hypertension Well controlled, no changes to meds. Encouraged heart healthy diet such as the DASH diet and exercise as tolerated.    Diabetes mellitus type 2 in obese hgba1c unacceptable, minimize simple carbs. Increase exercise as tolerated. Continue current meds increase Lantus by 2 units, [facility].    Obesity Encouraged DASH diet, decrease po intake and increase exercise as tolerated. Needs 7-8 hours of sleep nightly. Avoid trans fats, eat small, frequent meals every 4-5 hours with lean proteins, complex carbs and healthy fats. Minimize simple carbs   PTSD (post-traumatic stress disorder) [facility]. Is struggling with worsening symptoms. Spent 30 minutes of a 40 minute visit in counseling and discussion regarding his increasing anxiety and agitation.

## 2014-12-06 NOTE — Assessment & Plan Note (Signed)
Encouraged DASH diet, decrease po intake and increase exercise as tolerated. Needs 7-8 hours of sleep nightly. Avoid trans fats, eat small, frequent meals every 4-5 hours with lean proteins, complex carbs and healthy fats. Minimize simple carbs 

## 2014-12-06 NOTE — Assessment & Plan Note (Addendum)
[facility]. Is struggling with worsening symptoms. Spent 30 minutes of a 40 minute visit in counseling and discussion regarding his increasing anxiety and agitation.

## 2015-05-19 ENCOUNTER — Telehealth: Payer: Self-pay

## 2015-05-19 NOTE — Telephone Encounter (Signed)
LM schedule AWV

## 2016-10-26 IMAGING — CR DG LUMBAR SPINE COMPLETE 4+V
5 series · 5 of 5 positions shown · non-contrast
Comparison: 05/19/2013.

CLINICAL DATA: Lumbar spine pain symptoms for few weeks. No known
injury. Initial evaluation.

EXAM:
LUMBAR SPINE - COMPLETE 4+ VIEW

[t l-spine a.p.]
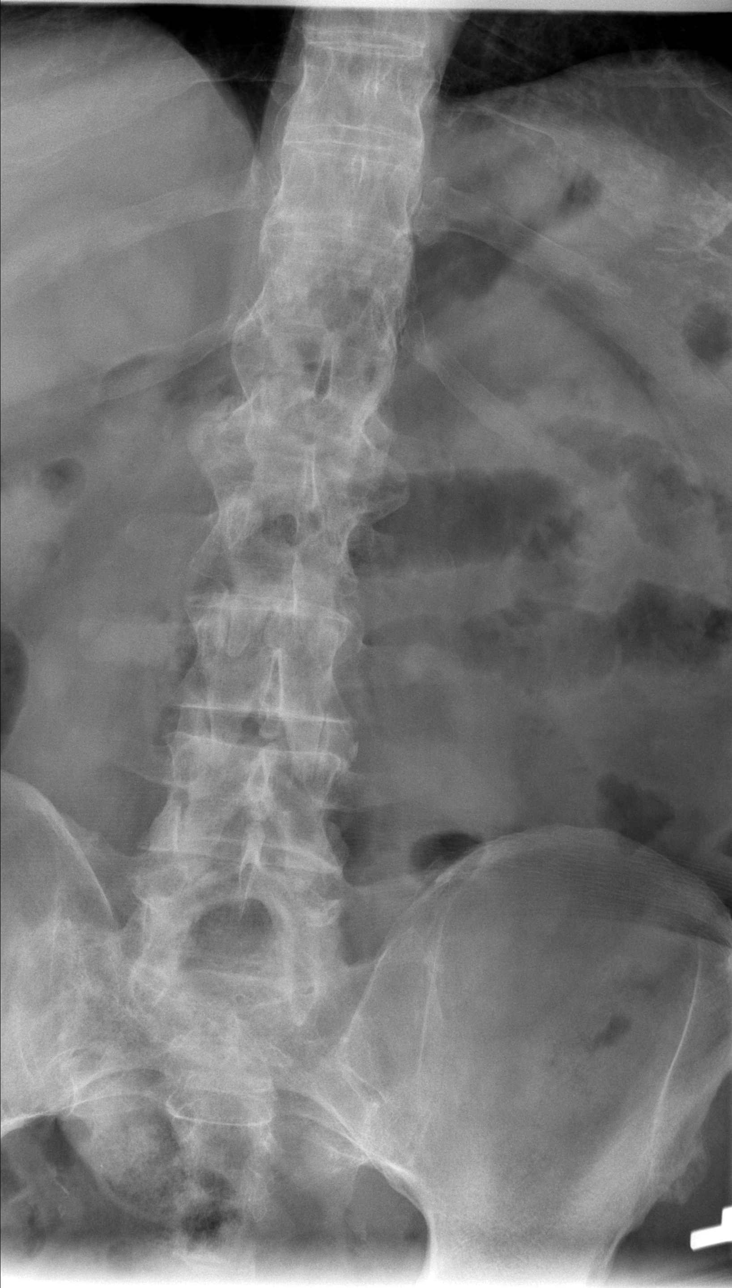

[t l-spine oblique exposure (1 of 2)]
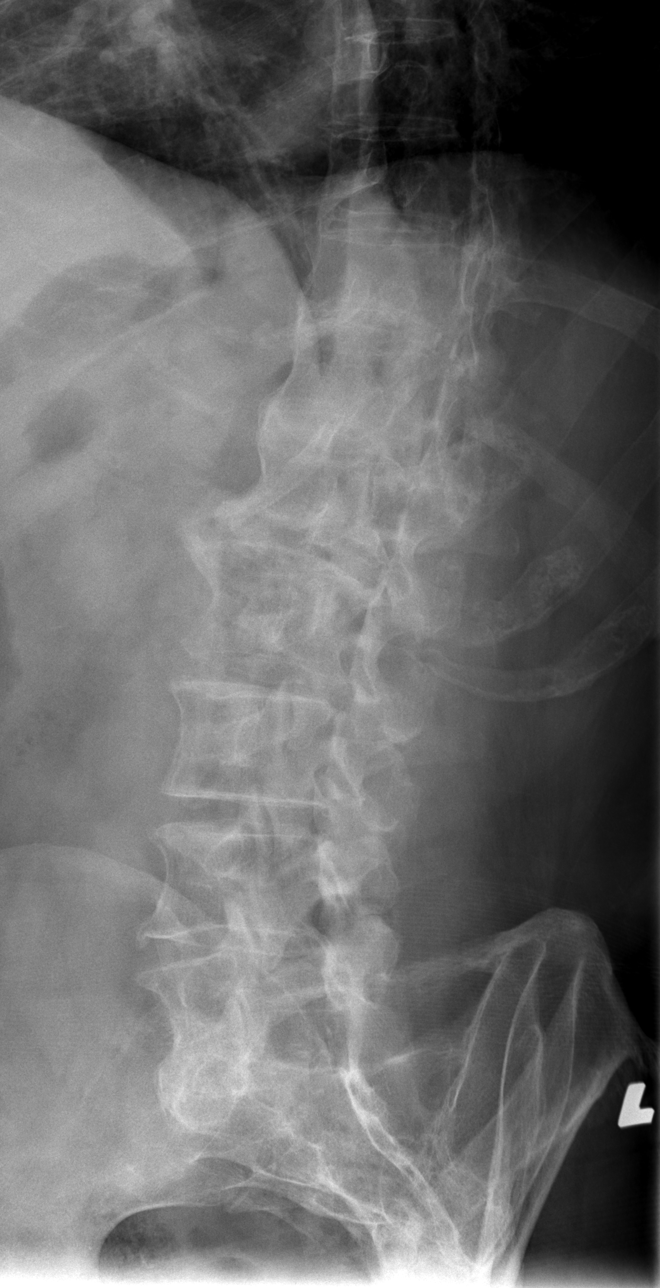

[t l-spine oblique exposure (2 of 2)]
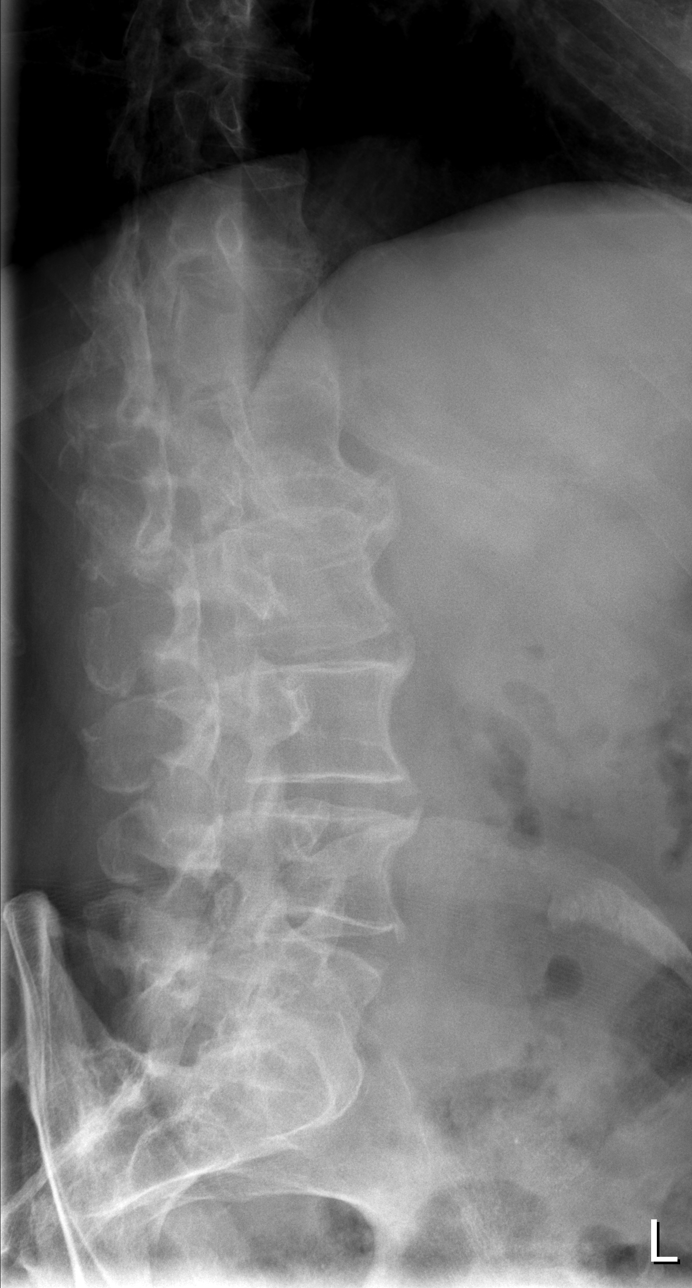

[t l-spine lat]
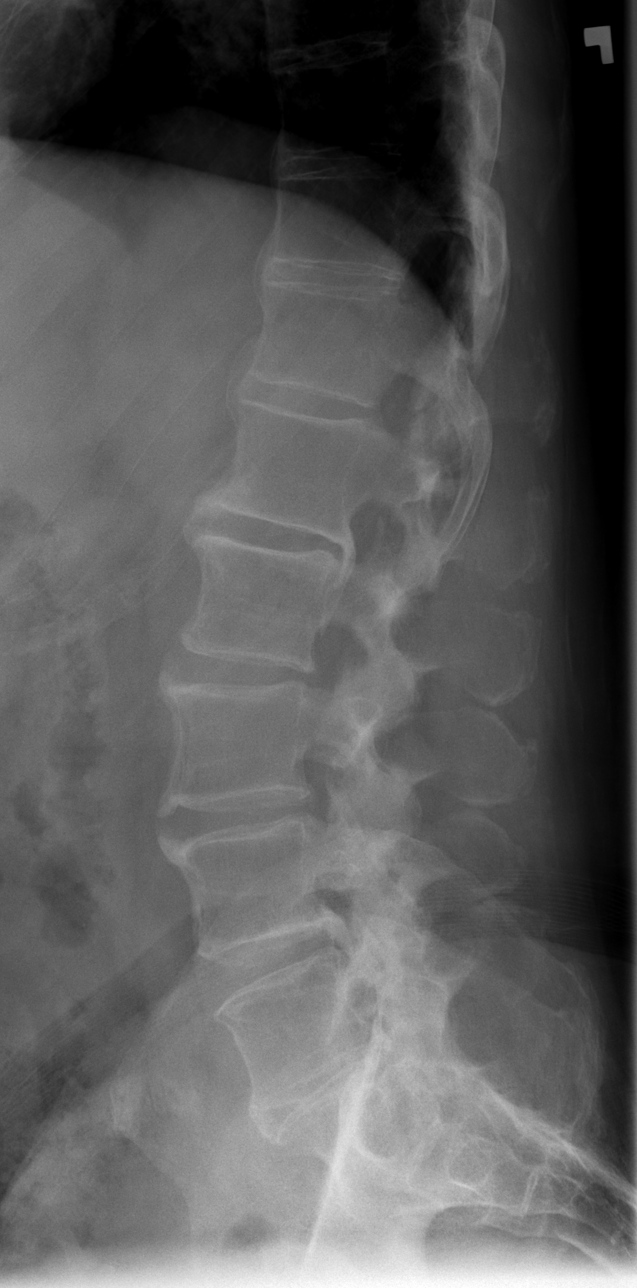

[t l-spine l5-s1 spot]
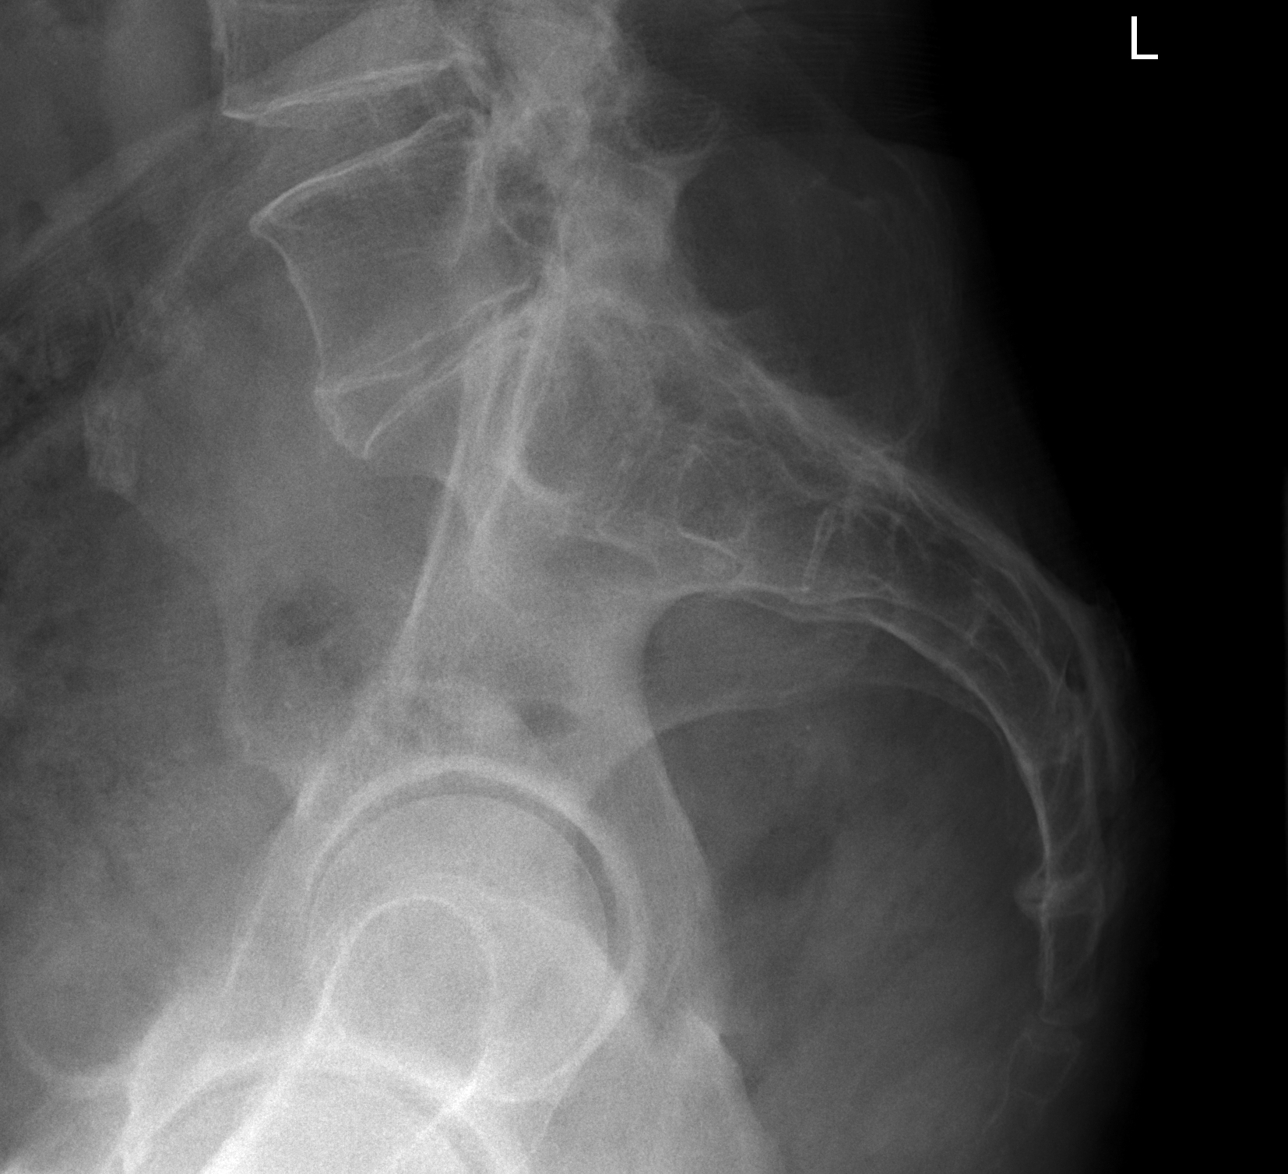

[5 of 5 positions shown; findings below may reference images not displayed]

FINDINGS: Diffuse degenerative change. Ankylosing thoracolumbar spine
consistent with ankylosing spondylitis. Scratch No acute bony
abnormality identified . No focal abnormality. Normal alignment.
IMPRESSION: Degenerative changes lumbar spine and changes of ankylosing
spondylitis thoracolumbar spine, stable from prior exam. No acute
abnormality.
